# Patient Record
Sex: Male | Born: 1980 | Hispanic: Yes | Marital: Single | Smoking: Never smoker
Health system: Southern US, Community
[De-identification: ages and names within clinical notes are randomized; demographics above are authoritative.]

## PROBLEM LIST (undated history)

## (undated) DIAGNOSIS — N200 Calculus of kidney: Secondary | ICD-10-CM

## (undated) DIAGNOSIS — R319 Hematuria, unspecified: Secondary | ICD-10-CM

## (undated) HISTORY — PX: OTHER SURGICAL HISTORY: SHX169

---

## 2014-12-05 ENCOUNTER — Ambulatory Visit (INDEPENDENT_AMBULATORY_CARE_PROVIDER_SITE_OTHER): Payer: Self-pay | Admitting: Urgent Care

## 2014-12-05 VITALS — BP 122/74 | HR 58 | Temp 98.6°F | Resp 17 | Ht 66.5 in | Wt 177.0 lb

## 2014-12-05 DIAGNOSIS — K219 Gastro-esophageal reflux disease without esophagitis: Secondary | ICD-10-CM | POA: Insufficient documentation

## 2014-12-05 DIAGNOSIS — R1013 Epigastric pain: Secondary | ICD-10-CM

## 2014-12-05 DIAGNOSIS — R1010 Upper abdominal pain, unspecified: Secondary | ICD-10-CM | POA: Insufficient documentation

## 2014-12-05 LAB — POCT CBC
Granulocyte percent: 62.6 %G (ref 37–80)
HEMATOCRIT: 48.6 % (ref 43.5–53.7)
HEMOGLOBIN: 16.7 g/dL (ref 14.1–18.1)
LYMPH, POC: 2.3 (ref 0.6–3.4)
MCH, POC: 28.9 pg (ref 27–31.2)
MCHC: 34.4 g/dL (ref 31.8–35.4)
MCV: 84 fL (ref 80–97)
MID (CBC): 0.8 (ref 0–0.9)
MPV: 6.8 fL (ref 0–99.8)
POC Granulocyte: 5.3 (ref 2–6.9)
POC LYMPH %: 27.9 % (ref 10–50)
POC MID %: 9.5 %M (ref 0–12)
Platelet Count, POC: 270 10*3/uL (ref 142–424)
RBC: 5.78 M/uL (ref 4.69–6.13)
RDW, POC: 12.5 %
WBC: 8.4 10*3/uL (ref 4.6–10.2)

## 2014-12-05 LAB — COMPREHENSIVE METABOLIC PANEL
ALK PHOS: 57 U/L (ref 39–117)
ALT: 27 U/L (ref 0–53)
AST: 18 U/L (ref 0–37)
Albumin: 4.4 g/dL (ref 3.5–5.2)
BUN: 15 mg/dL (ref 6–23)
CHLORIDE: 103 meq/L (ref 96–112)
CO2: 27 meq/L (ref 19–32)
CREATININE: 0.79 mg/dL (ref 0.50–1.35)
Calcium: 9.4 mg/dL (ref 8.4–10.5)
GLUCOSE: 96 mg/dL (ref 70–99)
POTASSIUM: 4.7 meq/L (ref 3.5–5.3)
Sodium: 140 mEq/L (ref 135–145)
TOTAL PROTEIN: 7.5 g/dL (ref 6.0–8.3)
Total Bilirubin: 2.2 mg/dL — ABNORMAL HIGH (ref 0.2–1.2)

## 2014-12-05 MED ORDER — ESOMEPRAZOLE MAGNESIUM 20 MG PO PACK: 20.0000 mg | PACK | Freq: Every day | ORAL | Status: AC

## 2014-12-05 NOTE — Progress Notes (Signed)
    MRN: [id number] DOB: 03/19/1981  Subjective:   James Arias is a 34 y.o. male presenting for chief complaint of Sore Throat; Headache; Back Pain; and Chest Pain  Reports 2 month history of epigastric pain, pain is intermittent, achy in nature and sometimes radiates to his right shoulder. Associated with excess burping and saliva. Problem occurs mostly while at work, works in Systems developerproduction at [employer]. Patient's diet is generally unhealthy, does not eat a lot of fiber, consists mainly of Latino food which is greasy, also eats very spicy foods. Denies fevers, chest pain, heart racing, palpitations, shortness of breath, neck pain, jaw pain, limb pain, nausea, vomiting, abdominal pain. Denies family history of heart disease. Has 1 aunt with diagnosis of diabetes. He denies smoking cigarettes or alcohol use. Denies any other aggravating or relieving factors, no other questions or concerns.  James Arias currently has no medications in their medication list. He has No Known Allergies.  James Arias  has no past medical history on file. Also  has no past surgical history on file.  ROS As in subjective.  Objective:   Vitals: BP 122/74 mmHg  Pulse 58  Temp(Src) 98.6 F (37 C) (Oral)  Resp 17  Ht 5' 6.5" (1.689 m)  Wt 177 lb (80.287 kg)  BMI 28.14 kg/m2  SpO2 98%  Physical Exam  Constitutional: He is oriented to person, place, and time. He appears well-developed and well-nourished.  HENT:  Mouth/Throat: Oropharynx is clear and moist.  Cardiovascular: Normal rate, regular rhythm and intact distal pulses.  Exam reveals no gallop and no friction rub.   No murmur heard. Pulmonary/Chest: No respiratory distress. He has no wheezes. He has no rales.  Abdominal: Soft. Bowel sounds are normal. He exhibits no distension and no mass. There is no tenderness.  Neurological: He is alert and oriented to person, place, and time.  Skin: Skin is warm and dry. No rash noted. No erythema. No pallor.    Results for orders placed or performed in visit on 12/05/14 (from the past 24 hour(s))  POCT CBC     Status: None   Collection Time: 12/05/14  9:46 AM  Result Value Ref Range   WBC 8.4 4.6 - 10.2 K/uL   Lymph, poc 2.3 0.6 - 3.4   POC LYMPH PERCENT 27.9 10 - 50 %L   MID (cbc) 0.8 0 - 0.9   POC MID % 9.5 0 - 12 %M   POC Granulocyte 5.3 2 - 6.9   Granulocyte percent 62.6 37 - 80 %G   RBC 5.78 4.69 - 6.13 M/uL   Hemoglobin 16.7 14.1 - 18.1 g/dL   HCT, POC 40.948.6 81.143.5 - 53.7 %   MCV 84.0 80 - 97 fL   MCH, POC 28.9 27 - 31.2 pg   MCHC 34.4 31.8 - 35.4 g/dL   RDW, POC 91.412.5 %   Platelet Count, POC 270 142 - 424 K/uL   MPV 6.8 0 - 99.8 fL   Assessment and Plan :   1. Abdominal pain, epigastric 2. Gastroesophageal reflux disease, esophagitis presence not specified - Labs pending, pysical exam findings reassuring, recommended dietary modifications, start Nexium daily, followup in 4 weeks.  Wallis BambergMario Neleh Muldoon, PA-C [facility] [phone] 12/05/2014 9:09 AM

## 2014-12-05 NOTE — Patient Instructions (Signed)
Opciones de alimentos para pacientes con reflujo gastroesofgico (Food Choices for Gastroesophageal Reflux Disease) Cuando se tiene reflujo gastroesofgico (ERGE), los alimentos que se ingieren y los hbitos de alimentacin son muy importantes. Elegir los alimentos adecuados puede ayudar a aliviar las molestias ocasionadas por el ERGE. QU PAUTAS GENERALES DEBO SEGUIR?  Elija las frutas, los vegetales, los cereales integrales, los productos lcteos, la carne de vaca, de pescado y de ave con bajo contenido de grasas.  Limite las grasas, como los aceites, los aderezos para ensalada, la manteca, los frutos secos y el aguacate.  Lleve un registro de las comidas para identificar los alimentos que ocasionan sntomas.  Evite los alimentos que le ocasionen reflujo. Pueden ser distintos para cada persona.  Haga comidas pequeas con frecuencia en lugar de tres comidas abundantes todos los das.  Coma lentamente, en un clima distendido.  Limite el consumo de alimentos fritos.  Cocine los alimentos utilizando mtodos que no sean la fritura.  Evite el consumo alcohol.  Evite beber grandes cantidades de lquidos con las comidas.  Evite agacharse o recostarse hasta despus de 2 o 3horas de haber comido. QU ALIMENTOS NO SE RECOMIENDAN? Los siguientes son algunos alimentos y bebidas que pueden empeorar los sntomas: Vegetales Tomates. Jugo de tomate. Salsa de tomate y espagueti. Ajes. Cebolla y ajo. Rbano picante. Frutas Naranjas, pomelos y limn (fruta y jugo). Carnes Carnes de vaca, de pescado y de ave con gran contenido de grasas. Esto incluye los perros calientes, las costillas, el jamn, la salchicha, el salame y el tocino. Lcteos Leche entera y leche chocolatada. Crema cida. Crema. Mantequilla. Helados. Queso crema.  Bebidas Caf y t negro, con o sin cafena Bebidas gaseosas o energizantes. Condimentos Salsa picante. Salsa barbacoa.  Dulces/postres Chocolate y cacao.  Rosquillas. Menta y mentol. Grasas y aceites Alimentos con alto contenido de grasas, incluidas las papas fritas. Otros Vinagre. Especias picantes, como la pimienta negra, la pimienta blanca, la pimienta roja, la pimienta de cayena, el curry en polvo, los clavos de olor, el jengibre y el chile en polvo. Los artculos mencionados arriba pueden no ser una lista completa de las bebidas y los alimentos que se deben evitar. Comunquese con el nutricionista para recibir ms informacin. Document Released: 02/18/2005 Document Revised: 05/16/2013 ExitCare Patient Information 2015 [employer]. This information is not intended to replace advice given to you by your health care provider. Make sure you discuss any questions you have with your health care provider.  

## 2015-07-09 ENCOUNTER — Ambulatory Visit (INDEPENDENT_AMBULATORY_CARE_PROVIDER_SITE_OTHER): Payer: Self-pay | Admitting: Urgent Care

## 2015-07-09 VITALS — BP 126/77 | HR 60 | Temp 97.9°F | Resp 16 | Ht 66.25 in | Wt 178.4 lb

## 2015-07-09 DIAGNOSIS — R109 Unspecified abdominal pain: Secondary | ICD-10-CM

## 2015-07-09 DIAGNOSIS — M545 Low back pain, unspecified: Secondary | ICD-10-CM

## 2015-07-09 DIAGNOSIS — R350 Frequency of micturition: Secondary | ICD-10-CM

## 2015-07-09 DIAGNOSIS — R631 Polydipsia: Secondary | ICD-10-CM

## 2015-07-09 DIAGNOSIS — M62830 Muscle spasm of back: Secondary | ICD-10-CM

## 2015-07-09 LAB — POCT CBC
GRANULOCYTE PERCENT: 62 % (ref 37–80)
HCT, POC: 44.1 % (ref 43.5–53.7)
HEMOGLOBIN: 16 g/dL (ref 14.1–18.1)
Lymph, poc: 2.7 (ref 0.6–3.4)
MCH: 30.3 pg (ref 27–31.2)
MCHC: 36.3 g/dL — AB (ref 31.8–35.4)
MCV: 83.2 fL (ref 80–97)
MID (cbc): 1.1 — AB (ref 0–0.9)
MPV: 6.6 fL (ref 0–99.8)
PLATELET COUNT, POC: 277 10*3/uL (ref 142–424)
POC Granulocyte: 6.3 (ref 2–6.9)
POC LYMPH PERCENT: 26.7 %L (ref 10–50)
POC MID %: 11.3 %M (ref 0–12)
RBC: 5.3 M/uL (ref 4.69–6.13)
RDW, POC: 13 %
WBC: 10.1 10*3/uL (ref 4.6–10.2)

## 2015-07-09 LAB — POC MICROSCOPIC URINALYSIS (UMFC): Mucus: ABSENT

## 2015-07-09 LAB — POCT URINALYSIS DIP (MANUAL ENTRY)
Bilirubin, UA: NEGATIVE
GLUCOSE UA: NEGATIVE
Ketones, POC UA: NEGATIVE
Leukocytes, UA: NEGATIVE
NITRITE UA: NEGATIVE
PH UA: 6
PROTEIN UA: NEGATIVE
RBC UA: NEGATIVE
Spec Grav, UA: 1.02
Urobilinogen, UA: 1

## 2015-07-09 LAB — POCT GLYCOSYLATED HEMOGLOBIN (HGB A1C): Hemoglobin A1C: 5.1

## 2015-07-09 MED ORDER — MELOXICAM 7.5 MG PO TABS: 7.5000 mg | ORAL_TABLET | Freq: Every day | ORAL | Status: AC

## 2015-07-09 MED ORDER — CYCLOBENZAPRINE HCL 5 MG PO TABS
5.0000 mg | ORAL_TABLET | Freq: Three times a day (TID) | ORAL | Status: AC | PRN
Start: 2015-07-09 — End: ?

## 2015-07-09 NOTE — Patient Instructions (Signed)
Dolor de espalda en adultos  (Back Pain, Adult)  El dolor de espalda es muy frecuente en los adultos. La causa del dolor de espalda es rara vez peligrosa y el dolor a menudo mejora con el tiempo. Es posible que se desconozca la causa de esta afección. Algunas causas comunes son las siguientes:  · Distensión de los músculos o ligamentos que sostienen la columna vertebral.  · Desgaste (degeneración) de los discos vertebrales.  · Artritis.  · Lesiones directas en la espalda.  En muchas personas, el dolor de espalda es recurrente. Como rara vez es peligroso, las personas pueden aprender a manejar esta afección por sí mismas.  INSTRUCCIONES PARA EL CUIDADO EN EL HOGAR  Controle su dolor de espalda a fin de detectar algún cambio. Las siguientes indicaciones ayudarán a aliviar cualquier molestia que pueda sentir:  · Permanezca activo. Si permanece sentado o de pie en un mismo lugar durante mucho tiempo, se tensiona la espalda. No se siente, conduzca o permanezca de pie en un mismo lugar durante más de 30 minutos seguidos. Realice caminatas cortas en superficies planas tan pronto como le sea posible. Trate de caminar un poco más de tiempo cada día.  · Haga ejercicio regularmente como se lo haya indicado el médico. El ejercicio ayuda a que su espalda se cure más rápidamente. También ayuda a prevenir futuras lesiones al mantener los músculos fuertes y flexibles.  · No permanezca en la cama. Si hace reposo más de 1 a 2 días, puede demorar su recuperación.  · Preste atención a su cuerpo al inclinarse y levantarse. Las posiciones más cómodas son las que ejercen menos tensión en la espalda en recuperación. Siempre use técnicas apropiadas para levantar objetos, como por ejemplo:    Flexionar las rodillas.    Mantener la carga cerca del cuerpo.    No torcerse.  · Encuentre una posición cómoda para dormir. Use un colchón firme y recuéstese de costado con las rodillas ligeramente flexionadas. Si se recuesta sobre la espalda, coloque  una almohada debajo de las rodillas.  · Evite sentir ansiedad o estrés. El estrés aumenta la tensión muscular y puede empeorar el dolor de espalda. Es importante reconocer si se siente ansioso o estresado y aprender maneras de controlarlo, por ejemplo haciendo ejercicio.  · Tome los medicamentos solamente como se lo haya indicado el médico. Los medicamentos de venta libre para aliviar el dolor y la inflamación a menudo son los más eficaces. El médico puede recetarle relajantes musculares. Estos medicamentos ayudan a calmar el dolor de modo que pueda reanudar más rápidamente sus actividades normales y el ejercicio saludable.  · Aplique hielo sobre la zona lesionada.    Ponga el hielo en una bolsa plástica.    Coloque una toalla entre la piel y la bolsa de hielo.    Deje el hielo durante 20 minutos, 2 a 3 veces por día, durante los primeros 2 o 3 días. Después de eso, puede alternar el hielo y el calor para reducir el dolor y los espasmos.  · Mantenga un peso saludable. El exceso de peso ejerce presión adicional sobre la espalda y hace que resulte difícil mantener una buena postura.  SOLICITE ATENCIÓN MÉDICA SI:  · Siente un dolor que no se alivia con reposo o medicamentos.  · Siente mucho dolor que se extiende a las piernas o los glúteos.  · El dolor no mejora en una semana.  · Siente dolor por la noche.  · Pierde peso.  · Siente escalofríos o fiebre.  SOLICITE ATENCIÓN MÉDICA DE INMEDIATO SI:   ·   Tiene nuevos problemas para controlar la vejiga o los intestinos.  · Siente debilidad o adormecimiento inusuales en los brazos o en las piernas.  · Siente náuseas o vómitos.  · Siente dolor abdominal.  · Siente que va a desmayarse.     Esta información no tiene como fin reemplazar el consejo del médico. Asegúrese de hacerle al médico cualquier pregunta que tenga.     Document Released: 05/11/2005 Document Revised: 06/01/2014  Elsevier Interactive Patient Education ©2016 Elsevier Inc.

## 2015-07-09 NOTE — Progress Notes (Signed)
MRN: [id number] DOB: 08/02/1980  Subjective:   James Arias is a 35 y.o. male presenting for chief complaint of Dysuria and Back Pain  Reports ~2 month history of flank pain worse with bending and stooping. He also reports urinary frequency, polydipsia, does not hydrate well however but does not drink sodas. Reports several uncles with diagnosis of diabetes. Patient admits that he made significant changes in his diet from his last visit due to GERD symptoms. This problem has since resolved. However, patient admits that he eats plenty of carbs. Denies fever, n/v, abdominal pain, dysuria, hematuria, cloudy urine. Denies smoking cigarettes, alcohol use. 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 He does exercise regularly, plays soccer.  [place] has a current medication list which includes the following prescription(s): esomeprazole. Also has No Known Allergies.  James Arias  has no past medical history on file. Also  has no past surgical history on file.  Objective:   Vitals: BP 126/77 mmHg  Pulse 60  Temp(Src) 97.9 F (36.6 C) (Oral)  Resp 16  Ht 5' 6.25" (1.683 m)  Wt 178 lb 6.4 oz (80.922 kg)  BMI 28.57 kg/m2  SpO2 98%  Physical Exam  Constitutional: He is oriented to person, place, and time. He appears well-developed and well-nourished.  Cardiovascular: Normal rate, regular rhythm and intact distal pulses.  Exam reveals no gallop and no friction rub.   No murmur heard. Pulmonary/Chest: No respiratory distress. He has no wheezes. He has no rales.  Abdominal: Soft. Bowel sounds are normal. He exhibits no distension and no mass. There is no tenderness.  Musculoskeletal:       Lumbar back: He exhibits decreased range of motion (flexion), tenderness (over area depicted) and spasm (lumbar region). He exhibits no bony tenderness, no swelling, no edema, no deformity and no laceration.       Back:  Neurological: He is alert and oriented to person,  place, and time.  Skin: Skin is warm and dry.   Results for orders placed or performed in visit on 07/09/15 (from the past 24 hour(s))  POCT urinalysis dipstick     Status: None   Collection Time: 07/09/15 10:30 AM  Result Value Ref Range   Color, UA yellow yellow   Clarity, UA clear clear   Glucose, UA negative negative   Bilirubin, UA negative negative   Ketones, POC UA negative negative   Spec Grav, UA 1.020    Blood, UA negative negative   pH, UA 6.0    Protein Ur, POC negative negative   Urobilinogen, UA 1.0    Nitrite, UA Negative Negative   Leukocytes, UA Negative Negative  POCT Microscopic Urinalysis ([facility])     Status: None   Collection Time: 07/09/15 10:30 AM  Result Value Ref Range   WBC,UR,HPF,POC None None WBC/hpf   RBC,UR,HPF,POC None None RBC/hpf   Bacteria None None, Too numerous to count   Mucus Absent Absent   Epithelial Cells, UR Per Microscopy None None, Too numerous to count cells/hpf  POCT CBC     Status: Abnormal   Collection Time: 07/09/15 11:05 AM  Result Value Ref Range   WBC 10.1 4.6 - 10.2 K/uL   Lymph, poc 2.7 0.6 - 3.4   POC LYMPH PERCENT 26.7 10 - 50 %L   MID (cbc) 1.1 (A) 0 - 0.9   POC MID % 11.3 0 - 12 %M   POC Granulocyte 6.3 2 - 6.9   Granulocyte percent 62.0  37 - 80 %G   RBC 5.30 4.69 - 6.13 M/uL   Hemoglobin 16.0 14.1 - 18.1 g/dL   HCT, POC 16.1 09.6 - 53.7 %   MCV 83.2 80 - 97 fL   MCH, POC 30.3 27 - 31.2 pg   MCHC 36.3 (A) 31.8 - 35.4 g/dL   RDW, POC 04.5 %   Platelet Count, POC 277 142 - 424 K/uL   MPV 6.6 0 - 99.8 fL  POCT glycosylated hemoglobin (Hb A1C)     Status: None   Collection Time: 07/09/15 11:06 AM  Result Value Ref Range   Hemoglobin A1C 5.1    Assessment and Plan :   1. Bilateral low back pain without sciatica 2. Lumbar paraspinal muscle spasm - Likely musculoskeletal in origin, work restrictions provided for 2 weeks. Recheck at that point if no improvement. Consider x-ray, PT, additional blood work.  3.  Urinary frequency 4. Polydipsia - Unclear etiology but patient does admit to drinking Gatorade daily at work. Advised that he stop this to see if it helps with his urinary frequency. Patient agreed.  Wallis Bamberg, PA-C [facility] [phone] 07/09/2015 10:44 AM

## 2018-02-08 ENCOUNTER — Encounter: Payer: Self-pay | Admitting: Emergency Medicine

## 2018-02-08 ENCOUNTER — Ambulatory Visit: Payer: Self-pay | Admitting: Emergency Medicine

## 2018-02-08 VITALS — BP 124/74 | HR 99 | Temp 98.9°F | Resp 16 | Ht 65.25 in | Wt 177.2 lb

## 2018-02-08 DIAGNOSIS — R1010 Upper abdominal pain, unspecified: Secondary | ICD-10-CM

## 2018-02-08 DIAGNOSIS — R12 Heartburn: Secondary | ICD-10-CM | POA: Insufficient documentation

## 2018-02-08 MED ORDER — OMEPRAZOLE 20 MG PO CPDR: 20.0000 mg | DELAYED_RELEASE_CAPSULE | Freq: Every day | ORAL | 3 refills | Status: AC

## 2018-02-08 NOTE — Progress Notes (Signed)
James Arias 37 y.o.   Chief Complaint  Patient presents with  . Abdominal Pain    x 3 days    HISTORY OF PRESENT ILLNESS: This is a 37 y.o. male complaining of upper abdominal pain that started 3 days ago similar to an episode he had 6 to 7 months ago when he was seen at an urgent care clinic and started on Nexium for 1 month.  He did well them.  Still eating and drinking okay.  Denies nausea or vomiting.  Denies fever or chills.  Denies melena or rectal bleeding.  At times he gets pain to the epigastric area with heartburn but other times the pain is localized to the right upper quadrant.  Food makes it worse.  No other significant symptoms.  HPI   Prior to Admission medications   Medication Sig Start Date End Date Taking? Authorizing Provider  cyclobenzaprine (FLEXERIL) 5 MG tablet Take 1-2 tablets (5-10 mg total) by mouth 3 (three) times daily as needed for muscle spasms. Patient not taking: Reported on 02/08/2018 07/09/15   Wallis Bamberg, PA-C  esomeprazole (NEXIUM) 20 MG packet Take 20 mg by mouth daily before breakfast. Patient not taking: Reported on 07/09/2015 12/05/14   Wallis Bamberg, PA-C  meloxicam (MOBIC) 7.5 MG tablet Take 1-2 tablets (7.5-15 mg total) by mouth daily. Patient not taking: Reported on 02/08/2018 07/09/15   Wallis Bamberg, PA-C    No Known Allergies  Patient Active Problem List   Diagnosis Date Noted  . Abdominal pain, epigastric 12/05/2014  . Esophageal reflux 12/05/2014    No past medical history on file.  No past surgical history on file.  Social History   Socioeconomic History  . Marital status: Single    Spouse name: Not on file  . Number of children: Not on file  . Years of education: Not on file  . Highest education level: Not on file  Occupational History  . Not on file  Social Needs  . Financial resource strain: Not on file  . Food insecurity:    Worry: Not on file    Inability: Not on file  . Transportation needs:    Medical:  Not on file    Non-medical: Not on file  Tobacco Use  . Smoking status: Never Smoker  . Smokeless tobacco: Never Used  Substance and Sexual Activity  . Alcohol use: Not on file  . Drug use: Never  . Sexual activity: Not on file  Lifestyle  . Physical activity:    Days per week: Not on file    Minutes per session: Not on file  . Stress: Not on file  Relationships  . Social connections:    Talks on phone: Not on file    Gets together: Not on file    Attends religious service: Not on file    Active member of club or organization: Not on file    Attends meetings of clubs or organizations: Not on file    Relationship status: Not on file  . Intimate partner violence:    Fear of current or ex partner: Not on file    Emotionally abused: Not on file    Physically abused: Not on file    Forced sexual activity: Not on file  Other Topics Concern  . Not on file  Social History Narrative  . Not on file    No family history on file.   Review of Systems  Constitutional: Negative.  Negative for chills and fever.  HENT:  Negative.   Eyes: Negative.   Respiratory: Negative.  Negative for cough and shortness of breath.   Cardiovascular: Negative.  Negative for chest pain and palpitations.  Gastrointestinal: Positive for abdominal pain and heartburn. Negative for blood in stool, melena, nausea and vomiting.  Genitourinary: Negative.  Negative for dysuria.  Musculoskeletal: Negative.   Skin: Negative.  Negative for rash.  Neurological: Negative.   Endo/Heme/Allergies: Negative.   All other systems reviewed and are negative.   Vitals:   02/08/18 1540  BP: 124/74  Pulse: 99  Resp: 16  Temp: 98.9 F (37.2 C)  SpO2: 97%     Physical Exam  Constitutional: He is oriented to person, place, and time. He appears well-developed and well-nourished.  HENT:  Head: Normocephalic and atraumatic.  Nose: Nose normal.  Eyes: Pupils are equal, round, and reactive to light. Conjunctivae and  EOM are normal.  Neck: Normal range of motion. Neck supple.  Cardiovascular: Normal rate, regular rhythm and normal heart sounds.  Pulmonary/Chest: Effort normal and breath sounds normal.  Abdominal: Soft. Bowel sounds are normal. He exhibits no distension and no mass. There is no tenderness. There is no rebound and no guarding.  Musculoskeletal: Normal range of motion.  Neurological: He is alert and oriented to person, place, and time.  Skin: Skin is warm and dry. Capillary refill takes less than 2 seconds. No rash noted.  Psychiatric: He has a normal mood and affect. His behavior is normal.  Vitals reviewed.    ASSESSMENT & PLAN: James Arias was seen today for abdominal pain.  Diagnoses and all orders for this visit:  Pain of upper abdomen -     CBC with Differential/Platelet -     Comprehensive metabolic panel -     Lipase -     omeprazole (PRILOSEC) 20 MG capsule; Take 1 capsule (20 mg total) by mouth daily.  Heartburn    Patient Instructions  Opciones de alimentos para pacientes con reflujo gastroesofgico - Adultos (Food Choices for Gastroesophageal Reflux Disease, Adult) Cuando se tiene reflujo gastroesofgico (ERGE), los alimentos que se ingieren y los hbitos de alimentacin son muy importantes. Elegir los alimentos adecuados puede ayudar a [employer]. QU PAUTAS DEBO SEGUIR?  Elija las frutas, los vegetales, los cereales integrales y los productos lcteos con bajo contenido de [place].  Elija las carnes de [place], de pescado y de ave con bajo contenido de grasas.  Limite las grasas, [address] [place], los aderezos para [place], la [place], los frutos secos y Programme researcher, broadcasting/film/video.  Lleve un registro de alimentos. Esto ayuda a identificar los alimentos que ocasionan sntomas.  Evite los alimentos que le ocasionen sntomas. Pueden ser distintos para cada persona.  Haga comidas pequeas durante Glass blower/designer de 3 comidas abundantes.  Coma lentamente, en un lugar donde  est distendido.  Limite el consumo de alimentos fritos.  Cocine los alimentos utilizando mtodos que no sean la fritura.  Evite el consumo alcohol.  Evite beber grandes cantidades de lquidos con las comidas.  Evite agacharse o recostarse hasta despus de 2 o 3horas de haber comido.  QU ALIMENTOS NO SE RECOMIENDAN? Estos son algunos alimentos y bebidas que pueden empeorar los sntomas: Veterinary surgeon. Jugo de tomate. Salsa de tomate y espagueti. Ajes. Cebolla y [place]. Rbano picante. Frutas Naranjas, pomelos y limn (fruta y [place]). Carnes Carnes de [place], de pescado y de ave con gran contenido de grasas. Esto incluye los perros calientes, las [place], el [place], la [place], el salame y  el tocino. Lcteos Leche entera y [place]. [employer]. Crema. Mantequilla. Helados. Queso crema. Bebidas T o caf. Bebidas gaseosas o bebidas energizantes. Condimentos Salsa picante. Salsa barbacoa. Dulces/postres Chocolate y cacao. Rosquillas. Menta y mentol. Grasas y [employer]. Esto incluye las papas fritas. Otros Vinagre. Especias picantes. Esto incluye la pimienta negra, la pimienta blanca, la pimienta roja, la pimienta de cayena, el curry en polvo, los clavos de [place], el jengibre y el [place] en polvo. Esta no es [employer] de los alimentos y las bebidas que se Theatre stage manager. Comunquese con el nutricionista para recibir ms informacin. Esta informacin no tiene Theme park manager el consejo del mdico. Asegrese de hacerle al mdico cualquier pregunta que tenga. Document Released: 11/10/2011 Document Revised: 06/01/2014 Document Reviewed: 03/15/2013 Elsevier Interactive Patient Education  2017 Elsevier Inc. Merchant navy officer (Heartburn) La acidez estomacal es un tipo de dolor o de molestia que se puede presentar en la garganta o en el pecho. A menudo se la describe como Actuary. Tambin puede producir mal aliento. La sensacin de [employer] al acostarse o inclinarse. Puede deberse al retroceso (reflujo) de los contenidos estomacales hacia el tubo que conecta la boca con el estmago (esfago). CUIDADOS EN EL HOGAR Tome estas medidas para aliviar las molestias y [employer]. Dieta  Siga la dieta como se lo haya indicado el mdico. Tal vez deba evitar los siguientes alimentos y bebidas: ? Caf y t (con o sin cafena). ? Bebidas que contengan alcohol. ? Bebidas energizantes y deportivas. ? Gaseosas o refrescos. ? Chocolate y cacao. ? Menta y esencias de [address] ? Ajo y cebollas. ? Rbano picante. ? Alimentos muy condimentados y cidos, como pimientos, [place] en polvo, curry en polvo, vinagre, salsas picantes y Engineer, water. ? Frutas ctricas y sus jugos, como naranjas, limones y limas. ? Alimentos a base de tomates, como salsa roja, [place], salsa y pizza con salsa roja. ? Alimentos fritos y Lexicographer, como rosquillas, papas fritas y aderezos con alto contenido de Holiday representative. ? Carnes con alto contenido de [place], como hot dogs, filetes de entrecot, salchicha, jamn y tocino. ? Productos lcteos con alto contenido de grasa, como [place], [place] y [place] crema.  Consuma pequeas porciones de comida con ms frecuencia. Evite consumir porciones abundantes.  Evite beber mucho lquido con las comidas.  No coma durante las 2 o 3horas previas a la hora de [place].  No se acueste inmediatamente despus de comer.  No haga actividad fsica enseguida despus de comer. Instrucciones generales  Est atento a cualquier cambio en los sntomas.  Tome los medicamentos de venta libre y los recetados solamente como se lo haya indicado el mdico. No tome aspirina, ibuprofeno ni otros antiinflamatorios no esteroides (AINE), a menos que el mdico lo autorice.  No consuma ningn producto que contenga tabaco, lo que incluye cigarrillos, tabaco de Theatre manager y Administrator, Civil Service. Si necesita ayuda para dejar de fumar,  consulte al mdico.  Use ropa suelta. No use nada ajustado alrededor [employer].  Levante (eleve) unas 6pulgadas (15centmetros) la cabecera de la cama.  Intente bajar el nivel de estrs. Si necesita ayuda para hacerlo, consulte al [employer].  Si tiene sobrepeso, Media planner un peso saludable. Pregntele a su mdico cmo puede perder peso de manera segura.  Concurra a todas las visitas de control como se lo haya indicado el mdico. Esto es importante. SOLICITE AYUDA SI:  Aparecen nuevos sntomas.  [place]aja de [place] y no [place]  por qu.  Tiene dificultad para tragar o siente dolor al Darden Restaurantshacerlo.  Tiene sibilancias o tos que no desaparece.  Los sntomas no mejoran con Scientist, research (medical)el tratamiento.  Tiene acidez frecuentemente durante ms de Marsh & McLennandos semanas. SOLICITE AYUDA DE INMEDIATO SI:  Tiene dolor en los brazos, el cuello, los Lakeviewmaxilares, la dentadura o la espalda.  Berenice Primasranspira, se marea o tiene sensacin de desvanecimiento.  Siente falta de aire o Journalist, newspaperdolor en el pecho.  Vomita y el vmito es parecido a la sangre o a los granos de caf.  Las heces son sanguinolentas o de color negro. Esta informacin no tiene Theme park managercomo fin reemplazar el consejo del mdico. Asegrese de hacerle al mdico cualquier pregunta que tenga. Document Released: 01/21/2011 Document Revised: 01/30/2015 Document Reviewed: 09/05/2014 Elsevier Interactive Patient Education  2018 Elsevier Inc.      Edwina BarthMiguel Kenzee Bassin, MD [facility]

## 2018-02-08 NOTE — Patient Instructions (Signed)
Opciones de alimentos para pacientes con reflujo gastroesofgico - Adultos (Food Choices for Gastroesophageal Reflux Disease, Adult) Cuando se tiene reflujo gastroesofgico (ERGE), los alimentos que se ingieren y los hbitos de alimentacin son muy importantes. Elegir los alimentos adecuados puede ayudar a [employer]. QU PAUTAS DEBO SEGUIR?  Elija las frutas, los vegetales, los cereales integrales y los productos lcteos con bajo contenido de [place].  Elija las carnes de [place], de pescado y de ave con bajo contenido de grasas.  Limite las grasas, [address] [place], los aderezos para [place], la [place], los frutos secos y Programme researcher, broadcasting/film/video.  Lleve un registro de alimentos. Esto ayuda a identificar los alimentos que ocasionan sntomas.  Evite los alimentos que le ocasionen sntomas. Pueden ser distintos para cada persona.  Haga comidas pequeas durante Glass blower/designer de 3 comidas abundantes.  Coma lentamente, en un lugar donde est distendido.  Limite el consumo de alimentos fritos.  Cocine los alimentos utilizando mtodos que no sean la fritura.  Evite el consumo alcohol.  Evite beber grandes cantidades de lquidos con las comidas.  Evite agacharse o recostarse hasta despus de 2 o 3horas de haber comido.  QU ALIMENTOS NO SE RECOMIENDAN? Estos son algunos alimentos y bebidas que pueden empeorar los sntomas: Veterinary surgeon. Jugo de tomate. Salsa de tomate y espagueti. Ajes. Cebolla y [place]. Rbano picante. Frutas Naranjas, pomelos y limn (fruta y [place]). Carnes Carnes de [place], de pescado y de ave con gran contenido de grasas. Esto incluye los perros calientes, las [place], el [place], la salchicha, el salame y el tocino. Lcteos Leche entera y [place]. [employer]. Crema. Mantequilla. Helados. Queso crema. Bebidas T o caf. Bebidas gaseosas o bebidas energizantes. Condimentos Salsa picante. Salsa barbacoa. Dulces/postres Chocolate y cacao. Rosquillas.  Menta y mentol. Grasas y [employer]. Esto incluye las papas fritas. Otros Vinagre. Especias picantes. Esto incluye la pimienta negra, la pimienta blanca, la pimienta roja, la pimienta de cayena, el curry en polvo, los clavos de [place], el jengibre y el [place] en polvo. Esta no es [employer] de los alimentos y las bebidas que se Theatre stage manager. Comunquese con el nutricionista para recibir ms informacin. Esta informacin no tiene Theme park manager el consejo del mdico. Asegrese de hacerle al mdico cualquier pregunta que tenga. Document Released: 11/10/2011 Document Revised: 06/01/2014 Document Reviewed: 03/15/2013 Elsevier Interactive Patient Education  2017 Elsevier Inc. Merchant navy officer (Heartburn) La acidez estomacal es un tipo de dolor o de molestia que se puede presentar en la garganta o en el pecho. A menudo se la describe como Actuary. Tambin puede producir mal aliento. La sensacin de [employer] al acostarse o inclinarse. Puede deberse al retroceso (reflujo) de los contenidos estomacales hacia el tubo que conecta la boca con el estmago (esfago). CUIDADOS EN EL HOGAR Tome estas medidas para aliviar las molestias y [employer]. Dieta  Siga la dieta como se lo haya indicado el mdico. Tal vez deba evitar los siguientes alimentos y bebidas: ? Caf y t (con o sin cafena). ? Bebidas que contengan alcohol. ? Bebidas energizantes y deportivas. ? Gaseosas o refrescos. ? Chocolate y cacao. ? Menta y esencias de [address] ? Ajo y cebollas. ? Rbano picante. ? Alimentos muy condimentados y cidos, como pimientos, [place] en polvo, curry en polvo, vinagre, salsas picantes y Engineer, water. ? Frutas ctricas y sus jugos, como naranjas, limones y limas. ? Alimentos a base de tomates, como salsa [place], [place], salsa y  pizza con salsa roja. ? Alimentos fritos y Lexicographergrasos, como rosquillas, papas fritas y aderezos con alto contenido de Holiday representativegrasa. ? Carnes  con alto contenido de [place]grasa, como hot dogs, filetes de entrecot, salchicha, jamn y tocino. ? Productos lcteos con alto contenido de grasa, como [place] entera, V[place]amanmantequilla y Royal Kuniaqueso crema.  Consuma pequeas porciones de comida con ms frecuencia. Evite consumir porciones abundantes.  Evite beber mucho lquido con las comidas.  No coma durante las 2 o 3horas previas a la hora de Crab Orchardacostarse.  No se acueste inmediatamente despus de comer.  No haga actividad fsica enseguida despus de comer. Instrucciones generales  Est atento a cualquier cambio en los sntomas.  Tome los medicamentos de venta libre y los recetados solamente como se lo haya indicado el mdico. No tome aspirina, ibuprofeno ni otros antiinflamatorios no esteroides (AINE), a menos que el mdico lo autorice.  No consuma ningn producto que contenga tabaco, lo que incluye cigarrillos, tabaco de Theatre managermascar y Administrator, Civil Servicecigarrillos electrnicos. Si necesita ayuda para dejar de fumar, consulte al mdico.  Use ropa suelta. No use nada ajustado alrededor Reynolds Americande la cintura.  Levante (eleve) unas 6pulgadas (15centmetros) la cabecera de la cama.  Intente bajar el nivel de estrs. Si necesita ayuda para hacerlo, consulte al American Expressmdico.  Si tiene sobrepeso, Media planneradelgace hasta alcanzar un peso saludable. Pregntele a su mdico cmo puede perder peso de manera segura.  Concurra a todas las visitas de control como se lo haya indicado el mdico. Esto es importante. SOLICITE AYUDA SI:  Aparecen nuevos sntomas.  [place] de [place]o y no sabe por qu.  Tiene dificultad para tragar o siente dolor al Darden Restaurantshacerlo.  Tiene sibilancias o tos que no desaparece.  Los sntomas no mejoran con Scientist, research (medical)el tratamiento.  Tiene acidez frecuentemente durante ms de Marsh & McLennandos semanas. SOLICITE AYUDA DE INMEDIATO SI:  Tiene dolor en los brazos, el cuello, los Nenahnezadmaxilares, la dentadura o la espalda.  Berenice Primasranspira, se marea o tiene sensacin de desvanecimiento.  Siente falta de aire o Engineer, manufacturingdolor en el  pecho.  Vomita y el vmito es parecido a la sangre o a los granos de caf.  Las heces son sanguinolentas o de color negro. Esta informacin no tiene Theme park managercomo fin reemplazar el consejo del mdico. Asegrese de hacerle al mdico cualquier pregunta que tenga. Document Released: 01/21/2011 Document Revised: 01/30/2015 Document Reviewed: 09/05/2014 Elsevier Interactive Patient Education  [employer]2018 Elsevier Inc.

## 2018-02-09 LAB — CBC WITH DIFFERENTIAL/PLATELET
Basophils Absolute: 0 10*3/uL (ref 0.0–0.2)
Basos: 0 %
EOS (ABSOLUTE): 0.9 10*3/uL — ABNORMAL HIGH (ref 0.0–0.4)
EOS: 9 %
HEMATOCRIT: 43.9 % (ref 37.5–51.0)
HEMOGLOBIN: 15.1 g/dL (ref 13.0–17.7)
Immature Grans (Abs): 0 10*3/uL (ref 0.0–0.1)
Immature Granulocytes: 0 %
LYMPHS ABS: 2.8 10*3/uL (ref 0.7–3.1)
Lymphs: 30 %
MCH: 29.6 pg (ref 26.6–33.0)
MCHC: 34.4 g/dL (ref 31.5–35.7)
MCV: 86 fL (ref 79–97)
MONOCYTES: 8 %
Monocytes Absolute: 0.7 10*3/uL (ref 0.1–0.9)
NEUTROS ABS: 5.1 10*3/uL (ref 1.4–7.0)
Neutrophils: 53 %
Platelets: 259 10*3/uL (ref 150–450)
RBC: 5.1 x10E6/uL (ref 4.14–5.80)
RDW: 12.9 % (ref 12.3–15.4)
WBC: 9.6 10*3/uL (ref 3.4–10.8)

## 2018-02-09 LAB — COMPREHENSIVE METABOLIC PANEL
A/G RATIO: 1.8 (ref 1.2–2.2)
ALK PHOS: 59 IU/L (ref 39–117)
ALT: 27 IU/L (ref 0–44)
AST: 19 IU/L (ref 0–40)
Albumin: 4.6 g/dL (ref 3.5–5.5)
BILIRUBIN TOTAL: 1.9 mg/dL — AB (ref 0.0–1.2)
BUN/Creatinine Ratio: 20 (ref 9–20)
BUN: 17 mg/dL (ref 6–20)
CALCIUM: 9.1 mg/dL (ref 8.7–10.2)
CHLORIDE: 101 mmol/L (ref 96–106)
CO2: 24 mmol/L (ref 20–29)
Creatinine, Ser: 0.83 mg/dL (ref 0.76–1.27)
GFR calc Af Amer: 129 mL/min/{1.73_m2} (ref >59)
GFR calc non Af Amer: 112 mL/min/{1.73_m2} (ref >59)
GLOBULIN, TOTAL: 2.6 g/dL (ref 1.5–4.5)
Glucose: 91 mg/dL (ref 65–99)
POTASSIUM: 4 mmol/L (ref 3.5–5.2)
SODIUM: 140 mmol/L (ref 134–144)
Total Protein: 7.2 g/dL (ref 6.0–8.5)

## 2018-02-09 LAB — LIPASE: LIPASE: 43 U/L (ref 13–78)

## 2018-02-10 ENCOUNTER — Encounter: Payer: Self-pay | Admitting: *Deleted

## 2018-02-14 ENCOUNTER — Telehealth: Payer: Self-pay

## 2018-02-14 NOTE — Telephone Encounter (Signed)
Pt arrived to office without appt.  Would like to know about lab results, does have letter.  Reviewed labs.  Advised that, per provider, labs are unremarkable.  Provider did not feel that anything needed to be addressed with further treatment at this time. Pt had translator present with him. Expresses understanding.

## 2018-02-16 ENCOUNTER — Emergency Department (HOSPITAL_COMMUNITY)
Admission: EM | Admit: 2018-02-16 | Discharge: 2018-02-16 | Disposition: A | Discharge status: Home / Self Care | Payer: Self-pay | Attending: Emergency Medicine | Admitting: Emergency Medicine

## 2018-02-16 ENCOUNTER — Emergency Department (HOSPITAL_COMMUNITY): Payer: Self-pay

## 2018-02-16 ENCOUNTER — Encounter (HOSPITAL_COMMUNITY): Payer: Self-pay | Admitting: Emergency Medicine

## 2018-02-16 DIAGNOSIS — K2900 Acute gastritis without bleeding: Secondary | ICD-10-CM | POA: Insufficient documentation

## 2018-02-16 DIAGNOSIS — R1011 Right upper quadrant pain: Secondary | ICD-10-CM | POA: Insufficient documentation

## 2018-02-16 LAB — COMPREHENSIVE METABOLIC PANEL
ALBUMIN: 4 g/dL (ref 3.5–5.0)
ALT: 33 U/L (ref 0–44)
ANION GAP: 8 (ref 5–15)
AST: 21 U/L (ref 15–41)
Alkaline Phosphatase: 55 U/L (ref 38–126)
BUN: 14 mg/dL (ref 6–20)
CO2: 26 mmol/L (ref 22–32)
Calcium: 8.9 mg/dL (ref 8.9–10.3)
Chloride: 104 mmol/L (ref 98–111)
Creatinine, Ser: 0.81 mg/dL (ref 0.61–1.24)
GFR calc Af Amer: >60 mL/min (ref >60)
GFR calc non Af Amer: >60 mL/min (ref >60)
GLUCOSE: 103 mg/dL — AB (ref 70–99)
POTASSIUM: 3.9 mmol/L (ref 3.5–5.1)
Sodium: 138 mmol/L (ref 135–145)
Total Bilirubin: 1.8 mg/dL — ABNORMAL HIGH (ref 0.3–1.2)
Total Protein: 7 g/dL (ref 6.5–8.1)

## 2018-02-16 LAB — CBC
HEMATOCRIT: 43.9 % (ref 39.0–52.0)
HEMOGLOBIN: 15 g/dL (ref 13.0–17.0)
MCH: 29.5 pg (ref 26.0–34.0)
MCHC: 34.2 g/dL (ref 30.0–36.0)
MCV: 86.2 fL (ref 78.0–100.0)
Platelets: 227 10*3/uL (ref 150–400)
RBC: 5.09 MIL/uL (ref 4.22–5.81)
RDW: 12.5 % (ref 11.5–15.5)
WBC: 8.5 10*3/uL (ref 4.0–10.5)

## 2018-02-16 LAB — LIPASE, BLOOD: Lipase: 27 U/L (ref 11–51)

## 2018-02-16 MED ORDER — RANITIDINE HCL 150 MG PO TABS: 150.0000 mg | ORAL_TABLET | Freq: Two times a day (BID) | ORAL | 0 refills | Status: AC

## 2018-02-16 MED ORDER — GI COCKTAIL ~~LOC~~
30.0000 mL | Freq: Once | ORAL | Status: AC
  Administered 2018-02-16: 30 mL via ORAL
  Filled 2018-02-16: qty 30

## 2018-02-16 NOTE — ED Provider Notes (Signed)
Patient placed in Quick Look pathway, seen and evaluated   Chief Complaint: RUQ abdominal pain  HPI:   James Arias is a 37 y.o. male  Who presents to the emergency department for evaluation of 1 week of right upper quadrant abdominal pain.  He denies associated nausea vomiting or diarrhea but reports some decreased appetite.  Pain has been acutely worsening in the past 24 hours.  ROS: + Abdominal pain, decreased appetite. -Fevers, nausea, vomiting, diarrhea, urinary symptoms  Physical Exam:   Gen: No distress  Neuro: Awake and Alert  Skin: Warm    Focused Exam: Focal tenderness in the right upper quadrant with guarding. Heart RRR, lungs, CTA bilat  Initiation of care has begun. The patient has been counseled on the process, plan, and necessity for staying for the completion/evaluation, and the remainder of the medical screening examination  Legrand Rams 02/16/18 1655  Pricilla Loveless, MD 02/16/18 [phone]

## 2018-02-16 NOTE — ED Notes (Signed)
Pt stable, ambulatory, states understanding of discharge instructions 

## 2018-02-16 NOTE — ED Provider Notes (Signed)
[facility] EMERGENCY DEPARTMENT Provider Note   CSN: [id number] Arrival date & time: 02/16/18  1531   History   Chief Complaint Chief Complaint  Patient presents with  . Abdominal Pain    HPI James Arias is a 37 y.o. male.  HPI   37 year old male presents today with complaints of abdominal pain.  Patient notes approximately 1 week history of epigastric and right upper quadrant abdominal pain.  He describes this as a pulsing sensation, worse after eating.  He denies any associated nausea vomiting fever, lower abdominal pain, dysuria, diarrhea or constipation.  Patient was seen in outpatient clinic diagnosed with "acid" and started on omeprazole.  He notes he has been taking the medication over the last several days without significant improvement in symptoms.  Patient reports he does not drink alcohol or use ibuprofen.     History reviewed. No pertinent past medical history.  Patient Active Problem List   Diagnosis Date Noted  . Heartburn 02/08/2018  . Pain of upper abdomen 12/05/2014  . Esophageal reflux 12/05/2014    History reviewed. No pertinent surgical history.      Home Medications    Prior to Admission medications   Medication Sig Start Date End Date Taking? Authorizing Provider  cyclobenzaprine (FLEXERIL) 5 MG tablet Take 1-2 tablets (5-10 mg total) by mouth 3 (three) times daily as needed for muscle spasms. Patient not taking: Reported on 02/08/2018 07/09/15   Wallis Bamberg, PA-C  esomeprazole (NEXIUM) 20 MG packet Take 20 mg by mouth daily before breakfast. Patient not taking: Reported on 07/09/2015 12/05/14   Wallis Bamberg, PA-C  meloxicam (MOBIC) 7.5 MG tablet Take 1-2 tablets (7.5-15 mg total) by mouth daily. Patient not taking: Reported on 02/08/2018 07/09/15   Wallis Bamberg, PA-C  omeprazole (PRILOSEC) 20 MG capsule Take 1 capsule (20 mg total) by mouth daily. 02/08/18   Georgina Quint, MD  ranitidine (ZANTAC) 150 MG tablet Take  1 tablet (150 mg total) by mouth 2 (two) times daily. 02/16/18   Eyvonne Mechanic, PA-C    Family History History reviewed. No pertinent family history.  Social History Social History   Tobacco Use  . Smoking status: Never Smoker  . Smokeless tobacco: Never Used  Substance Use Topics  . Alcohol use: Not on file  . Drug use: Never     Allergies   Patient has no known allergies.   Review of Systems Review of Systems  All other systems reviewed and are negative.    Physical Exam Updated Vital Signs BP 123/81   Pulse (!) 57   Temp 98.7 F (37.1 C) (Oral)   Resp 18   SpO2 100%   Physical Exam  Constitutional: He is oriented to person, place, and time. He appears well-developed and well-nourished.  HENT:  Head: Normocephalic and atraumatic.  Eyes: Pupils are equal, round, and reactive to light. Conjunctivae are normal. Right eye exhibits no discharge. Left eye exhibits no discharge. No scleral icterus.  Neck: Normal range of motion. No JVD present. No tracheal deviation present.  Pulmonary/Chest: Effort normal. No stridor.  Abdominal:  Minimal tenderness palpation of epigastric and right upper quadrant, remainder abdomen soft nontender, no masses  Neurological: He is alert and oriented to person, place, and time. Coordination normal.  Psychiatric: He has a normal mood and affect. His behavior is normal. Judgment and thought content normal.  Nursing note and vitals reviewed.    ED Treatments / Results  Labs (all labs ordered are listed, but  only abnormal results are displayed) Labs Reviewed  COMPREHENSIVE METABOLIC PANEL - Abnormal; Notable for the following components:      Result Value   Glucose, Bld 103 (*)    Total Bilirubin 1.8 (*)    All other components within normal limits  LIPASE, BLOOD  CBC    EKG None  Radiology US Abdomen Limited Ruq  Result Date: 02/16/2018 CLINICAL DATA:  Right upper quadrant pain for week EXAM: ULTRASOUND ABDOMEN LIMITED  RIGHT UPPER QUADRANT COMPARISON:  None. FINDINGS: Gallbladder: No gallstones or wall thickening visualized. No sonographic Murphy sign noted by sonographer. Common bile duct: Diameter: 2.7 mm Liver: No focal lesion identified. Within normal limits in parenchymal echogenicity. Portal vein is patent on color Doppler imaging with normal direction of blood flow towards the liver. IMPRESSION: Normal right upper quadrant ultrasound Electronically Signed   By: Judie Petit.  Shick M.D.   On: 02/16/2018 17:54    Procedures Procedures (including critical care time)  Medications Ordered in ED Medications  gi cocktail (Maalox,Lidocaine,Donnatal) (30 mLs Oral Given 02/16/18 1937)     Initial Impression / Assessment and Plan / ED Course  I have reviewed the triage vital signs and the nursing notes.  Pertinent labs & imaging results that were available during my care of the patient were reviewed by me and considered in my medical decision making (see chart for details).     Labs: Lipase, CMP, CBC  Imaging: Ultrasound abdomen limited right upper quadrant  Consults:  Therapeutics:  Discharge Meds:   Assessment/Plan: Patient presentation most consistent with gastritis.  Ultrasound reassuring, labs reassuring no signs of infection or obstructive pathology.  Discharged with symptomatic care, strict return precautions and follow-up information.  Patient verbalized understanding and agreement to today's plan had no further questions or concerns.     Final Clinical Impressions(s) / ED Diagnoses   Final diagnoses:  RUQ pain  Acute gastritis without hemorrhage, unspecified gastritis type    ED Discharge Orders         Ordered    ranitidine (ZANTAC) 150 MG tablet  2 times daily     02/16/18 1948           HedgesTinnie Gens, PA-C 02/17/18 2209    Mancel Bale, MD 02/18/18 [phone]

## 2018-02-16 NOTE — ED Triage Notes (Signed)
Pt presents to ED for assessment of RUQ pain, denies n/v/d, c/o back pain with it.  States 1 week of pain, worsening in the last 24 hours.

## 2018-02-16 NOTE — Discharge Instructions (Addendum)
Please read attached information. If you experience any new or worsening signs or symptoms please return to the emergency room for evaluation. Please follow-up with your primary care provider or specialist as discussed. Please use medication prescribed only as directed and discontinue taking if you have any concerning signs or symptoms.   °

## 2019-07-25 IMAGING — US US ABDOMEN LIMITED
1 series · 14 of 25 positions shown · non-contrast
Comparison: None.

CLINICAL DATA: Right upper quadrant pain for week

EXAM:
ULTRASOUND ABDOMEN LIMITED RIGHT UPPER QUADRANT

[Series 1: us abdomen limited · 0.22mm/px · 14 of 48 slices shown]
[im 1/48]
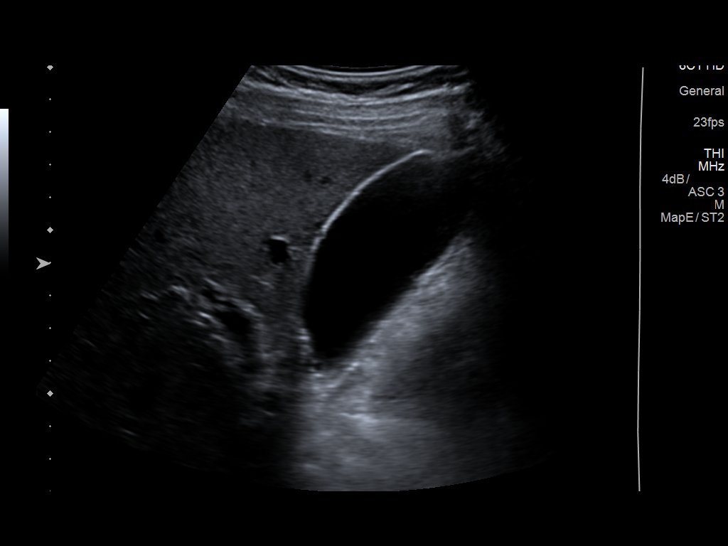
[im 4/48]
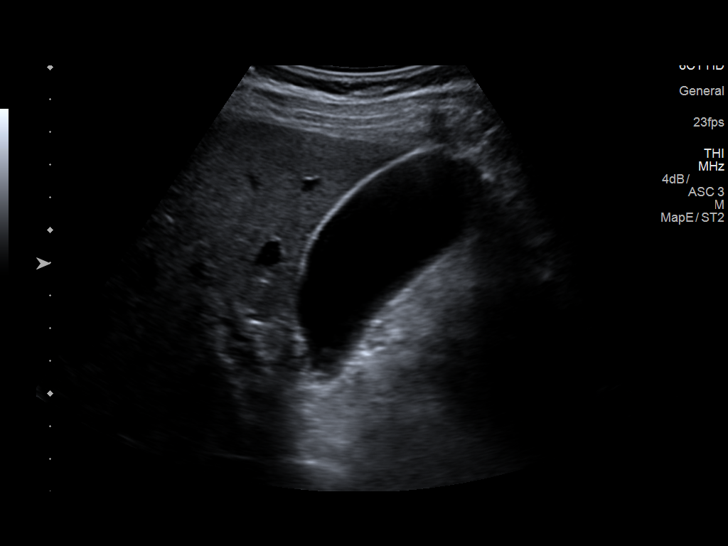
[im 8/48]
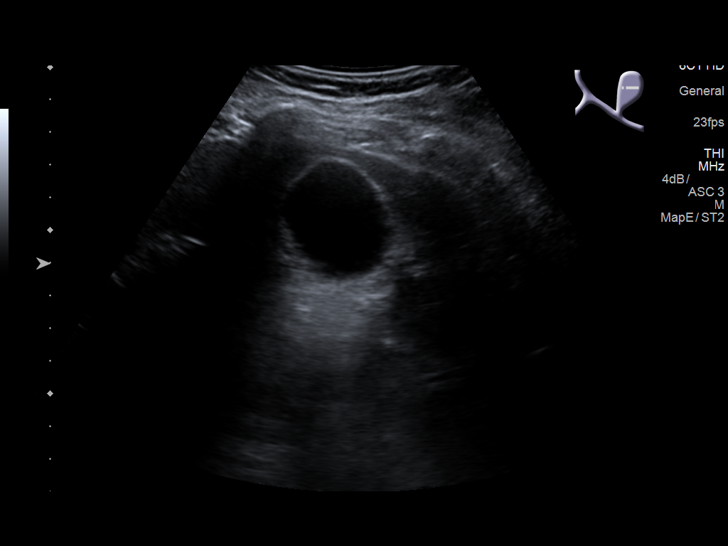
[im 12/48]
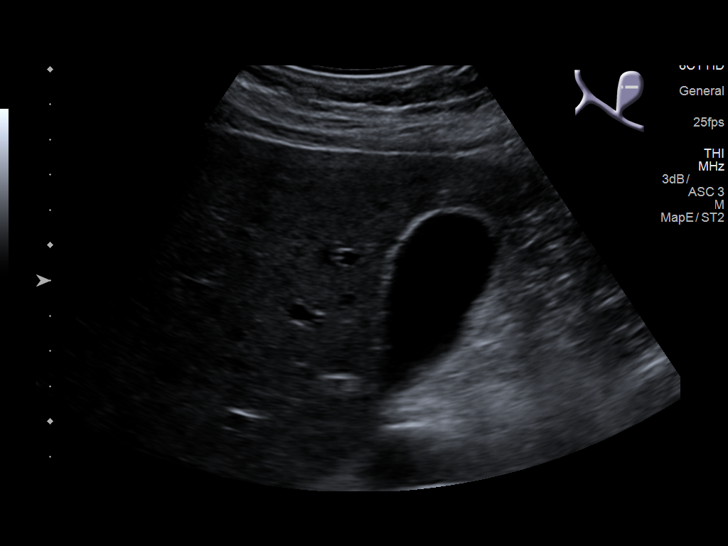
[im 16/48]
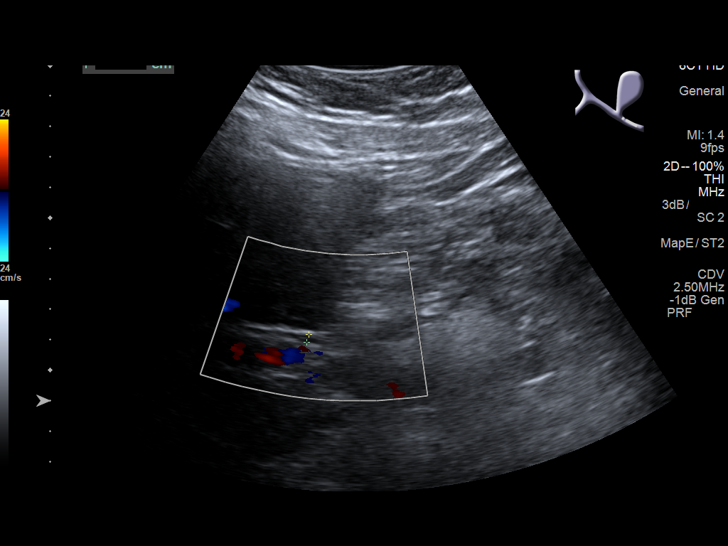
[im 18/48]
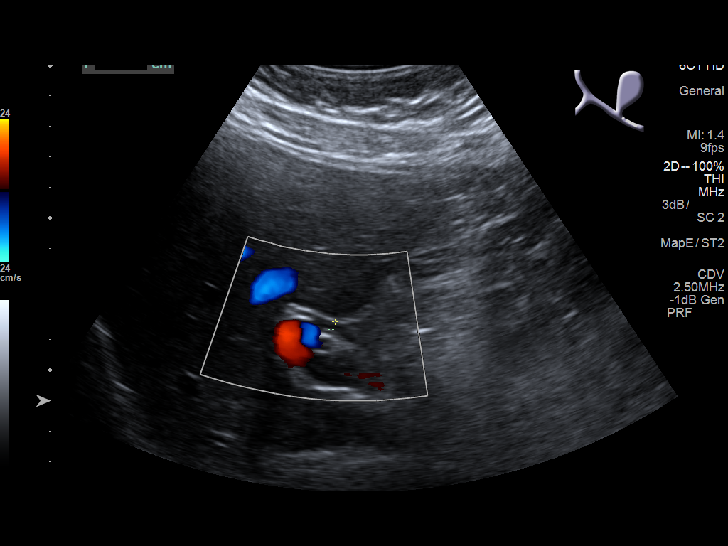
[im 22/48]
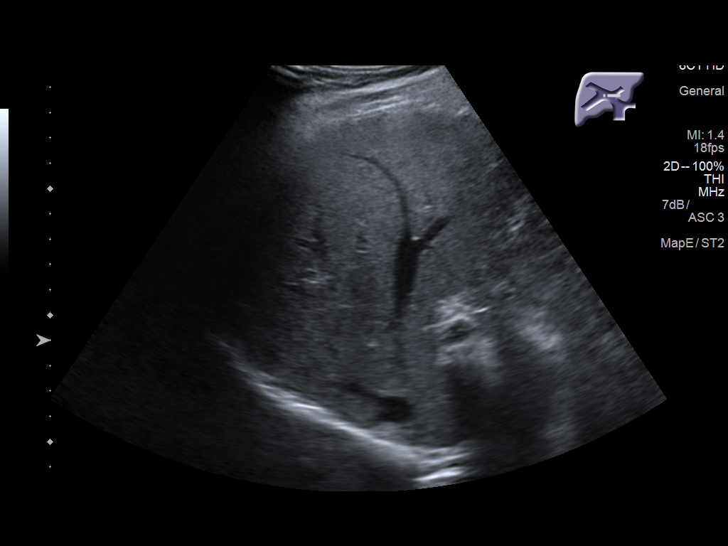
[im 26/48]
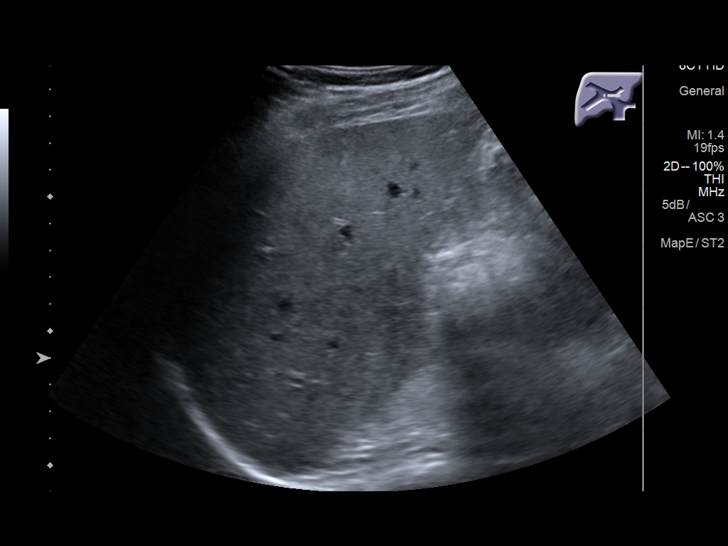
[im 30/48]
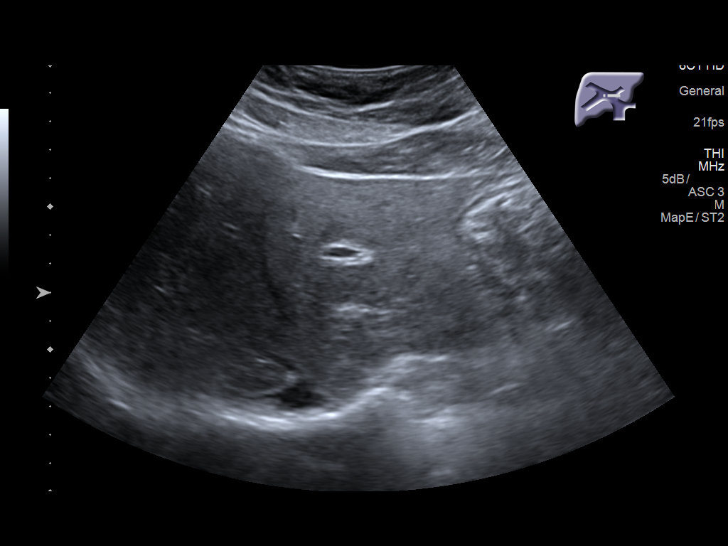
[im 32/48]
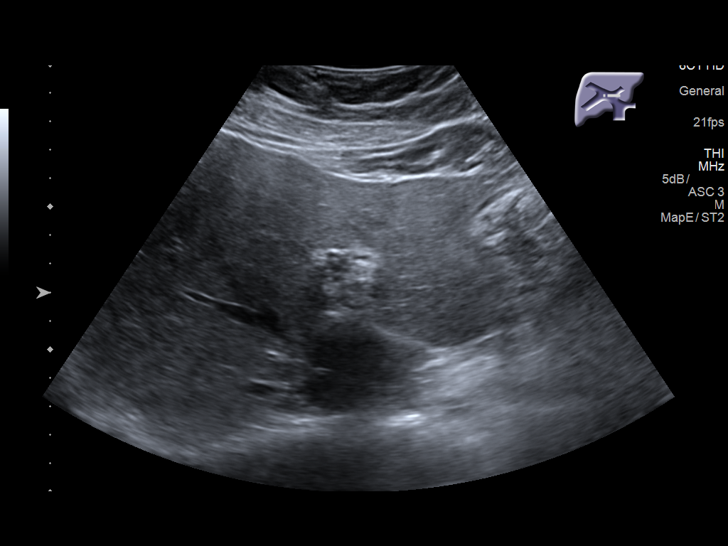
[im 36/48]
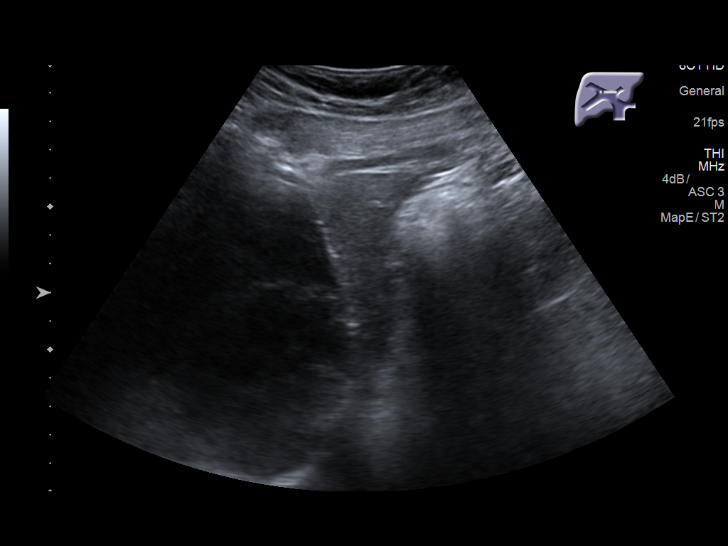
[im 40/48]
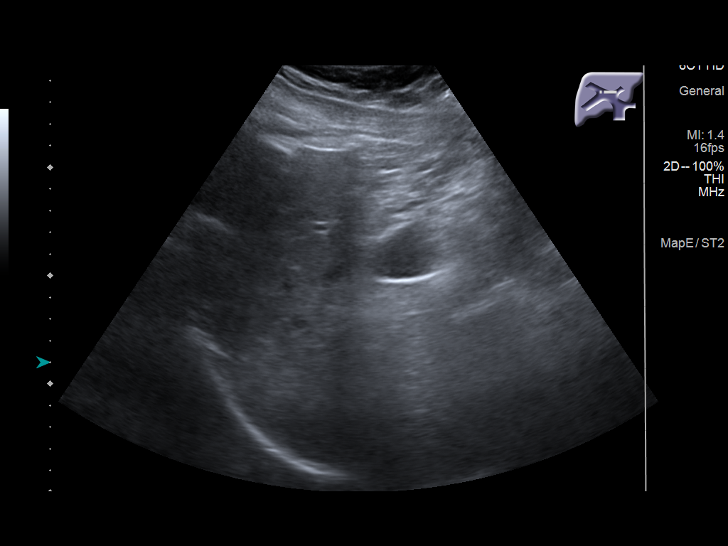
[im 44/48]
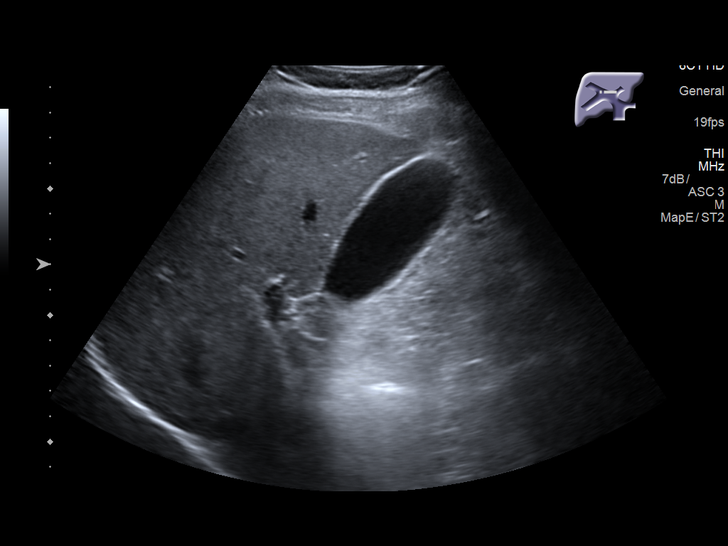
[im 48/48]
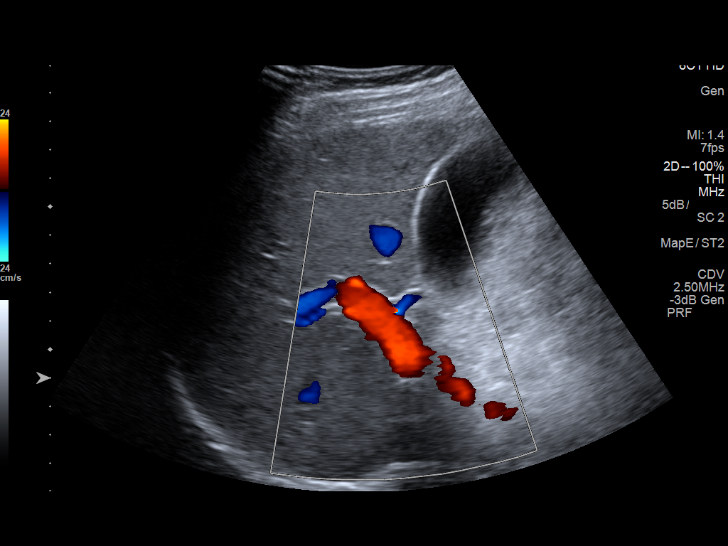

[14 of 25 positions shown; findings below may reference images not displayed]

FINDINGS: Gallbladder:

No gallstones or wall thickening visualized. No sonographic Murphy
sign noted by sonographer.

Common bile duct:

Diameter: 2.7 mm

Liver:

No focal lesion identified. Within normal limits in parenchymal
echogenicity. Portal vein is patent on color Doppler imaging with
normal direction of blood flow towards the liver.
IMPRESSION: Normal right upper quadrant ultrasound

## 2019-08-11 ENCOUNTER — Ambulatory Visit: Payer: Self-pay | Attending: Internal Medicine

## 2019-08-11 DIAGNOSIS — Z23 Encounter for immunization: Secondary | ICD-10-CM

## 2019-08-11 NOTE — Progress Notes (Signed)
   Covid-19 Vaccination Clinic  Name:  Glenard Keesling    MRN: [id number] DOB: 01-Mar-1981  08/11/2019  Mr. Prieto-Alvarado was observed post Covid-19 immunization for 15 minutes without incident. He was provided with Vaccine Information Sheet and instruction to access the V-Safe system.   Mr. Baldwin was instructed to call 911 with any severe reactions post vaccine: Marland Kitchen Difficulty breathing  . Swelling of face and throat  . A fast heartbeat  . A bad rash all over body  . Dizziness and weakness   Immunizations Administered    Name Date Dose VIS Date Route   Pfizer COVID-19 Vaccine 08/11/2019  3:49 PM 0.3 mL 05/05/2019 Intramuscular   Manufacturer: [employer], [employer]   Lot: [id number]   NDC: [address]-0

## 2019-09-06 ENCOUNTER — Ambulatory Visit: Payer: Self-pay | Attending: Internal Medicine

## 2019-09-06 DIAGNOSIS — Z23 Encounter for immunization: Secondary | ICD-10-CM

## 2019-09-06 NOTE — Progress Notes (Signed)
   Covid-19 Vaccination Clinic  Name:  Aldin Drees    MRN: [id number] DOB: [birth year]  09/06/2019  Mr. Prieto-Alvarado was observed post Covid-19 immunization for 15 minutes without incident. He was provided with Vaccine Information Sheet and instruction to access the V-Safe system.   Mr. Foti was instructed to call 911 with any severe reactions post vaccine: Marland Kitchen Difficulty breathing  . Swelling of face and throat  . A fast heartbeat  . A bad rash all over body  . Dizziness and weakness   Immunizations Administered    Name Date Dose VIS Date Route   Pfizer COVID-19 Vaccine 09/06/2019  3:28 PM 0.3 mL 05/05/2019 Intramuscular   Manufacturer: [employer], [employer]   Lot: [id number]   NDC: [address]-0

## 2020-05-30 ENCOUNTER — Other Ambulatory Visit: Payer: Self-pay

## 2020-05-30 ENCOUNTER — Emergency Department (HOSPITAL_COMMUNITY): Payer: Self-pay

## 2020-05-30 ENCOUNTER — Encounter (HOSPITAL_COMMUNITY): Payer: Self-pay | Admitting: Emergency Medicine

## 2020-05-30 ENCOUNTER — Emergency Department (HOSPITAL_COMMUNITY)
Admission: EM | Admit: 2020-05-30 | Discharge: 2020-05-30 | Disposition: A | Discharge status: Home / Self Care | Payer: Self-pay | Attending: Emergency Medicine | Admitting: Emergency Medicine

## 2020-05-30 DIAGNOSIS — N201 Calculus of ureter: Secondary | ICD-10-CM | POA: Insufficient documentation

## 2020-05-30 DIAGNOSIS — K219 Gastro-esophageal reflux disease without esophagitis: Secondary | ICD-10-CM | POA: Insufficient documentation

## 2020-05-30 LAB — COMPREHENSIVE METABOLIC PANEL
ALT: 34 U/L (ref 0–44)
AST: 24 U/L (ref 15–41)
Albumin: 4 g/dL (ref 3.5–5.0)
Alkaline Phosphatase: 47 U/L (ref 38–126)
Anion gap: 10 (ref 5–15)
BUN: 22 mg/dL — ABNORMAL HIGH (ref 6–20)
CO2: 24 mmol/L (ref 22–32)
Calcium: 9.2 mg/dL (ref 8.9–10.3)
Chloride: 106 mmol/L (ref 98–111)
Creatinine, Ser: 1.09 mg/dL (ref 0.61–1.24)
GFR, Estimated: >60 mL/min (ref >60)
Glucose, Bld: 137 mg/dL — ABNORMAL HIGH (ref 70–99)
Potassium: 3.4 mmol/L — ABNORMAL LOW (ref 3.5–5.1)
Sodium: 140 mmol/L (ref 135–145)
Total Bilirubin: 1.9 mg/dL — ABNORMAL HIGH (ref 0.3–1.2)
Total Protein: 7.3 g/dL (ref 6.5–8.1)

## 2020-05-30 LAB — URINALYSIS, ROUTINE W REFLEX MICROSCOPIC
Bilirubin Urine: NEGATIVE
Glucose, UA: NEGATIVE mg/dL
Ketones, ur: NEGATIVE mg/dL
Leukocytes,Ua: NEGATIVE
Nitrite: NEGATIVE
Protein, ur: NEGATIVE mg/dL
Specific Gravity, Urine: 1.017 (ref 1.005–1.030)
pH: 7 (ref 5.0–8.0)

## 2020-05-30 LAB — CBC
HCT: 42.8 % (ref 39.0–52.0)
Hemoglobin: 15.4 g/dL (ref 13.0–17.0)
MCH: 30.2 pg (ref 26.0–34.0)
MCHC: 36 g/dL (ref 30.0–36.0)
MCV: 83.9 fL (ref 80.0–100.0)
Platelets: 275 10*3/uL (ref 150–400)
RBC: 5.1 MIL/uL (ref 4.22–5.81)
RDW: 12.6 % (ref 11.5–15.5)
WBC: 13.6 10*3/uL — ABNORMAL HIGH (ref 4.0–10.5)
nRBC: 0 % (ref 0.0–0.2)

## 2020-05-30 LAB — LIPASE, BLOOD: Lipase: 24 U/L (ref 11–51)

## 2020-05-30 MED ORDER — OXYCODONE-ACETAMINOPHEN 5-325 MG PO TABS
1.0000 | ORAL_TABLET | Freq: Once | ORAL | Status: AC
  Administered 2020-05-30: 1 via ORAL
  Filled 2020-05-30: qty 1

## 2020-05-30 MED ORDER — OXYCODONE-ACETAMINOPHEN 5-325 MG PO TABS: 1.0000 | ORAL_TABLET | Freq: Three times a day (TID) | ORAL | 0 refills | Status: AC | PRN

## 2020-05-30 MED ORDER — ONDANSETRON 4 MG PO TBDP: 4.0000 mg | ORAL_TABLET | Freq: Three times a day (TID) | ORAL | 0 refills | Status: AC | PRN

## 2020-05-30 NOTE — ED Notes (Signed)
Pt d/c by MD and is provided w/ d/c instructions and follow up care, Pt out of the ed ambulatory by self

## 2020-05-30 NOTE — ED Triage Notes (Signed)
Pt arrives to ED with c/o RLQ abdominal pain that started x1 hour ago. Pain is constant and sharp. Radiation to LLQ. X1 episodes of emesis. Has chills and nausea.

## 2020-05-30 NOTE — ED Provider Notes (Signed)
[facility] EMERGENCY DEPARTMENT Provider Note   CSN: [id number] Arrival date & time: 05/30/20  1118     History Chief Complaint  Patient presents with  . Abdominal Pain    Tyland Klemens is a 40 y.o. male with a past medical history of GERD presenting to the ED with a chief complaint of abdominal pain.  Around 10 AM started having right lower quadrant abdominal pain radiating to his flank and back.  He does have some urinary frequency and feeling like he is not emptying his bladder completely.  Reports 1 episode of nonbloody emesis.  Has not tried medications to help with pain.  Reports somewhat similar pain in the past but was told "there is nothing going on."  He denies any prior abdominal surgeries but appears that he did have a possible inguinal hernia repair done.  He denies any chest pain, testicular pain or swelling, changes to bowel movements, fever or history of kidney stones.  History is provided by [place] medical interpreter.  HPI     History reviewed. No pertinent past medical history.  Patient Active Problem List   Diagnosis Date Noted  . Heartburn 02/08/2018  . Pain of upper abdomen 12/05/2014  . Esophageal reflux 12/05/2014    History reviewed. No pertinent surgical history.     History reviewed. No pertinent family history.  Social History   Tobacco Use  . Smoking status: Never Smoker  . Smokeless tobacco: Never Used  Substance Use Topics  . Drug use: Never    Home Medications Prior to Admission medications   Medication Sig Start Date End Date Taking? Authorizing Provider  ondansetron (ZOFRAN ODT) 4 MG disintegrating tablet Take 1 tablet (4 mg total) by mouth every 8 (eight) hours as needed for nausea or vomiting. 05/30/20  Yes Kiaraliz Rafuse, PA-C  oxyCODONE-acetaminophen (PERCOCET/ROXICET) 5-325 MG tablet Take 1 tablet by mouth every 8 (eight) hours as needed for severe pain. 05/30/20  Yes Angela Vazguez, PA-C  cyclobenzaprine  (FLEXERIL) 5 MG tablet Take 1-2 tablets (5-10 mg total) by mouth 3 (three) times daily as needed for muscle spasms. Patient not taking: No sig reported 07/09/15   Wallis Bamberg, PA-C  esomeprazole (NEXIUM) 20 MG packet Take 20 mg by mouth daily before breakfast. Patient not taking: No sig reported 12/05/14   Wallis Bamberg, PA-C  meloxicam (MOBIC) 7.5 MG tablet Take 1-2 tablets (7.5-15 mg total) by mouth daily. Patient not taking: No sig reported 07/09/15   Wallis Bamberg, PA-C  omeprazole (PRILOSEC) 20 MG capsule Take 1 capsule (20 mg total) by mouth daily. Patient not taking: No sig reported 02/08/18   Georgina Quint, MD  ranitidine (ZANTAC) 150 MG tablet Take 1 tablet (150 mg total) by mouth 2 (two) times daily. Patient not taking: No sig reported 02/16/18   Eyvonne Mechanic, PA-C    Allergies    Patient has no known allergies.  Review of Systems   Review of Systems  Constitutional: Negative for appetite change, chills and fever.  HENT: Negative for ear pain, rhinorrhea, sneezing and sore throat.   Eyes: Negative for photophobia and visual disturbance.  Respiratory: Negative for cough, chest tightness, shortness of breath and wheezing.   Cardiovascular: Negative for chest pain and palpitations.  Gastrointestinal: Positive for abdominal pain and vomiting. Negative for blood in stool, constipation, diarrhea and nausea.  Genitourinary: Positive for difficulty urinating, flank pain and frequency. Negative for dysuria, hematuria and urgency.  Musculoskeletal: Negative for myalgias.  Skin: Negative for rash.  Neurological: Negative for dizziness, weakness and light-headedness.    Physical Exam Updated Vital Signs BP 114/69   Pulse (!) 58   Temp 98.6 F (37 C) (Oral)   Resp 18   SpO2 97%   Physical Exam Vitals and nursing note reviewed.  Constitutional:      General: He is not in acute distress.    Appearance: He is well-developed and well-nourished.  HENT:     Head: Normocephalic  and atraumatic.     Nose: Nose normal.  Eyes:     General: No scleral icterus.       Right eye: No discharge.        Left eye: No discharge.     Extraocular Movements: EOM normal.     Conjunctiva/sclera: Conjunctivae normal.  Cardiovascular:     Rate and Rhythm: Normal rate and regular rhythm.     Pulses: Intact distal pulses.     Heart sounds: Normal heart sounds. No murmur heard. No friction rub. No gallop.   Pulmonary:     Effort: Pulmonary effort is normal. No respiratory distress.     Breath sounds: Normal breath sounds.  Abdominal:     General: Bowel sounds are normal. There is no distension.     Palpations: Abdomen is soft.     Tenderness: There is abdominal tenderness (Right lower quadrant, right flank). There is no guarding.  Musculoskeletal:        General: No edema. Normal range of motion.     Cervical back: Normal range of motion and neck supple.  Skin:    General: Skin is warm and dry.     Findings: No rash.  Neurological:     Mental Status: He is alert.     Motor: No abnormal muscle tone.     Coordination: Coordination normal.  Psychiatric:        Mood and Affect: Mood and affect normal.     ED Results / Procedures / Treatments   Labs (all labs ordered are listed, but only abnormal results are displayed) Labs Reviewed  COMPREHENSIVE METABOLIC PANEL - Abnormal; Notable for the following components:      Result Value   Potassium 3.4 (*)    Glucose, Bld 137 (*)    BUN 22 (*)    Total Bilirubin 1.9 (*)    All other components within normal limits  CBC - Abnormal; Notable for the following components:   WBC 13.6 (*)    All other components within normal limits  URINALYSIS, ROUTINE W REFLEX MICROSCOPIC - Abnormal; Notable for the following components:   APPearance CLOUDY (*)    Hgb urine dipstick MODERATE (*)    Bacteria, UA RARE (*)    All other components within normal limits  URINE CULTURE  LIPASE, BLOOD    EKG None  Radiology CT Renal Stone  Study  Result Date: 05/30/2020 CLINICAL DATA:  RIGHT flank pain, suspected kidney stone EXAM: CT ABDOMEN AND PELVIS WITHOUT CONTRAST TECHNIQUE: Multidetector CT imaging of the abdomen and pelvis was performed following the standard protocol without IV contrast. COMPARISON:  Ultrasound of the abdomen from February 16, 2018 FINDINGS: Lower chest: Lung bases are clear. No sign of consolidation or evidence of pleural effusion. Mild mosaic ground-glass attenuation at the lung bases bilaterally could reflect atelectatic changes Hepatobiliary: Smooth hepatic contours. No pericholecystic stranding. Pancreas: Normal contour of the pancreas without ductal dilation grossly or sign of inflammation. Spleen: Normal splenic contour and size. Adrenals/Urinary Tract: Adrenal glands are normal. Marked  perinephric stranding on the RIGHT with mild RIGHT-sided hydroureteronephrosis. Tiny calcific density in the area of the RIGHT UVJ measuring approximately 2 mm. Urinary bladder with smooth contours. No additional signs of renal or ureteral calculi. Stomach/Bowel: Stomach under distended. Mild distension of small bowel loops in the RIGHT lower quadrant with some fecalized material in the lumen. No overt sign of obstruction. Appendix top normal size with dense material in the lumen and no sign of Peri pannus seal stranding stool throughout much of the colon no pericolonic stranding. Vascular/Lymphatic: Normal caliber abdominal aorta. There is no gastrohepatic or hepatoduodenal ligament lymphadenopathy. No retroperitoneal or mesenteric lymphadenopathy. No pelvic sidewall lymphadenopathy. Reproductive: Prostate unremarkable by CT. Other: Signs of prior RIGHT inguinal herniorrhaphy. No ascites. Small amount of fluid along the course of the RIGHT ureter anterior to the psoas likely related to inflammatory stranding. Musculoskeletal: No acute musculoskeletal process. No destructive bone finding. IMPRESSION: 1. 2 mm calculus in the area of  the RIGHT UVJ with mild RIGHT-sided hydroureteronephrosis and marked perinephric stranding. 2. Mild distension of small bowel loops in the RIGHT lower quadrant with some fecalized material in the lumen. No overt sign of obstruction. Findings could be related to mild ileus. 3. Signs of prior RIGHT inguinal herniorrhaphy. 4. Mild mosaic ground-glass attenuation at the lung bases bilaterally could reflect atelectatic changes. Correlate with any respiratory symptoms that would suggest viral illness. 5. Top-normal appendix with either appendicoliths in the lumen or retained contrast from previous barium evaluation. Electronically Signed   By: Zetta Bills M.D.   On: 05/30/2020 17:25    Procedures Procedures (including critical care time)  Medications Ordered in ED Medications  oxyCODONE-acetaminophen (PERCOCET/ROXICET) 5-325 MG per tablet 1 tablet (1 tablet Oral Given 05/30/20 1627)    ED Course  I have reviewed the triage vital signs and the nursing notes.  Pertinent labs & imaging results that were available during my care of the patient were reviewed by me and considered in my medical decision making (see chart for details).  Clinical Course as of 05/30/20 1827  Thu May 30, 2020  1633 Hgb urine dipstickMarland Kitchen): MODERATE [HK]  1633 RBC / HPF: 11-20 Will check for kidney stone. [HK]    Clinical Course User Index [HK] Delia Heady, PA-C   MDM Rules/Calculators/A&P                          40 year old male with past medical history of GERD presenting to the ED with a chief complaint of abdominal pain.  Around 10 AM started having right lower quadrant abdominal pain radiating to the flank and back.  Reports feelings of incomplete voiding.  One episode of nonbloody emesis associated with the pain.  On exam he has some tenderness of the right lower quadrant and right flank.  He is in no acute distress.  Denies any testicular pain or swelling.  Lab work significant for leukocytosis of 13.6.  Urinalysis  with hematuria.  Other lab work is unremarkable.  Will obtain CT renal stone study to rule out kidney stone based on his hematuria.  6:27 PM CT renal stone study shows 2 mm right sided ureteral lithiasis near the UVJ.  There is stranding which I feel is secondary to the stone.  His urine does have rare bacteria but I will send this for a culture.  He is afebrile without any recent use of antipyretics.  I feel that his leukocytosis is more reactive from his pain.  Patient  symptoms controlled here and here is in no acute distress.  Question viral respiratory illness on his CT but patient asymptomatic and is declining Covid testing.  He is not hypoxic.  Will treat with pain medication, antiemetic and have him follow-up with urology.  We will have him follow-up with results of urine culture if it is available.  Return precautions given.  Patient is hemodynamically stable, in NAD, and able to ambulate in the ED. Evaluation does not show pathology that would require ongoing emergent intervention or inpatient treatment. I explained the diagnosis to the patient. Pain has been managed and has no complaints prior to discharge. Patient is comfortable with above plan and is stable for discharge at this time. All questions were answered prior to disposition. Strict return precautions for returning to the ED were discussed. Encouraged follow up with PCP.   Prior to providing a prescription for a controlled substance, I independently reviewed the patient's recent prescription history on the [place] Controlled Substance Reporting System. The patient had no recent or regular prescriptions and was deemed appropriate for a brief, less than 3 day prescription of narcotic for acute analgesia.  An After Visit Summary was printed and given to the patient.   Portions of this note were generated with Scientist, clinical (histocompatibility and immunogenetics). Dictation errors may occur despite best attempts at proofreading.  Final Clinical  Impression(s) / ED Diagnoses Final diagnoses:  Ureterolithiasis    Rx / DC Orders ED Discharge Orders         Ordered    oxyCODONE-acetaminophen (PERCOCET/ROXICET) 5-325 MG tablet  Every 8 hours PRN        05/30/20 1746    ondansetron (ZOFRAN ODT) 4 MG disintegrating tablet  Every 8 hours PRN        05/30/20 1746           Dietrich Pates, PA-C 05/30/20 1830    Long, Arlyss Repress, MD 05/31/20 [phone]

## 2020-05-30 NOTE — Discharge Instructions (Addendum)
Your CT scan today showed a 2 mm kidney stone on the right side. This is what is causing your pain. I have provided you with medications to help with your pain, nausea.  Make sure you are drinking plenty of fluids. Your urine was sent for culture so if you need to be on an antibiotic based on the bacteria grown we will call you and let you know If your symptoms do not improve in 4 to 5 days you will need to follow-up with the urologist listed below. Return to the ER if you start to experience fevers, worsening pain, uncontrollable vomiting, chest pain or shortness of breath.   Su tomografa computarizada de hoy mostr un clculo renal de 2 mm en el lado derecho. Esto es lo que le est causando dolor. Le he proporcionado medicamentos para Engineer, materials y las nuseas. Asegrese de beber muchos lquidos. Su orina fue enviada para cultivo, por lo que si necesita tomar un antibitico en funcin de las bacterias que crecen, lo llamaremos y se lo Engineer, mining. Si sus sntomas no mejoran en 4 a 5 das, deber Education officer, environmental un seguimiento con el urlogo que se indica a continuacin. Regrese a la sala de emergencias si comienza a experimentar fiebre, empeoramiento del dolor, vmitos incontrolables, dolor en el pecho o dificultad para respirar.

## 2020-06-01 LAB — URINE CULTURE: Culture: <10000 — AB

## 2021-06-23 IMAGING — CT CT RENAL STONE PROTOCOL
2 of 4 series · 15 of 46 positions shown, 17 images · non-contrast
Comparison: Ultrasound of the abdomen from February 16, 2018

CLINICAL DATA: RIGHT flank pain, suspected kidney stone

EXAM:
CT ABDOMEN AND PELVIS WITHOUT CONTRAST
TECHNIQUE: Multidetector CT imaging of the abdomen and pelvis was performed
following the standard protocol without IV contrast.

[Series 3: ap without · axial · non-contrast · 0.69mm/px · z∈[-609,-164]mm · 12 of 101 slices shown, 14 images]
[im 6/101  soft-tissue]
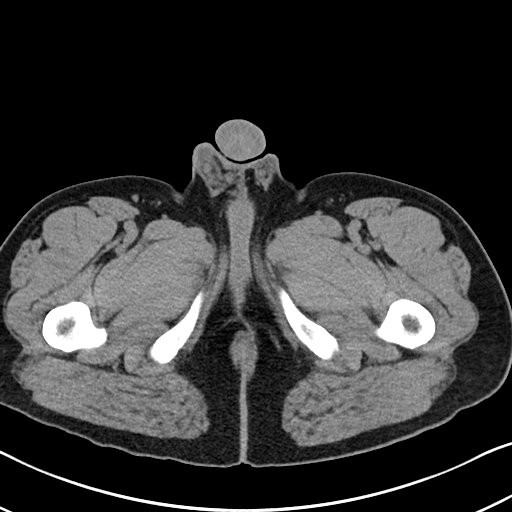
[im 6/101  bone]
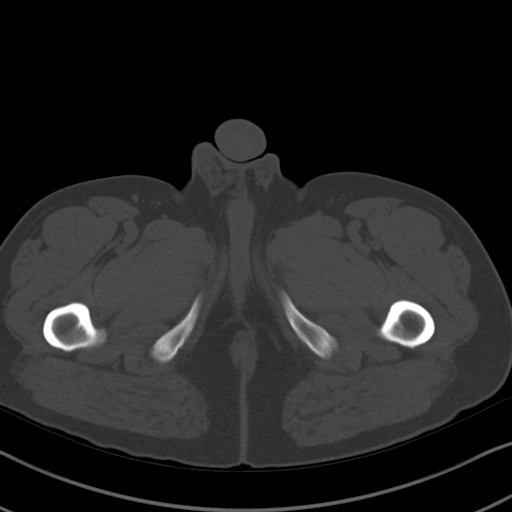
[im 18/101  soft-tissue]
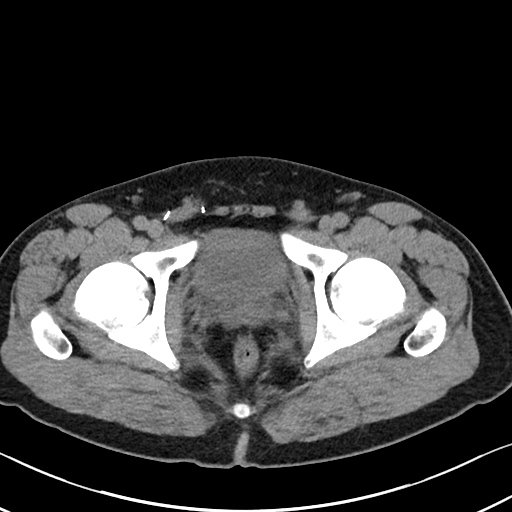
[im 24/101  soft-tissue]
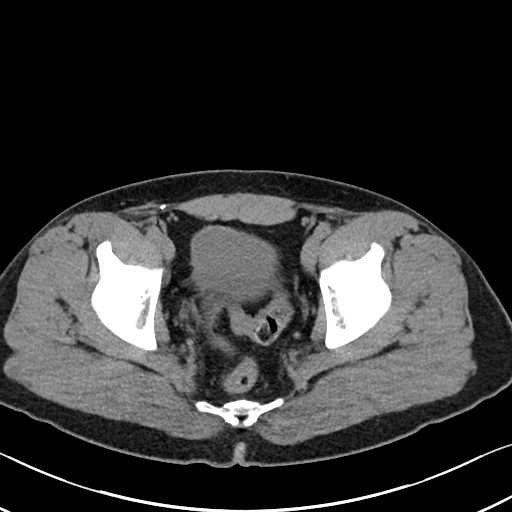
[im 30/101  soft-tissue]
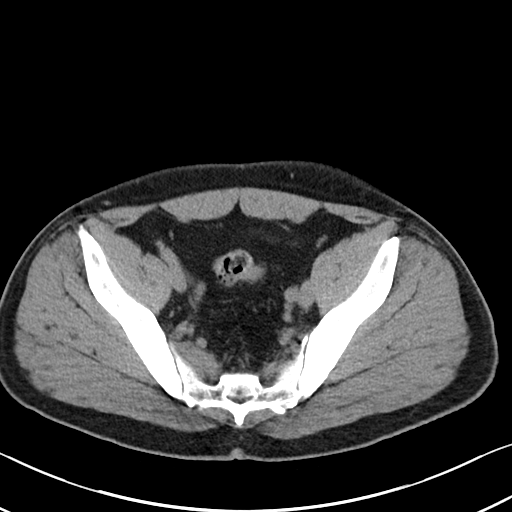
[im 42/101  soft-tissue]
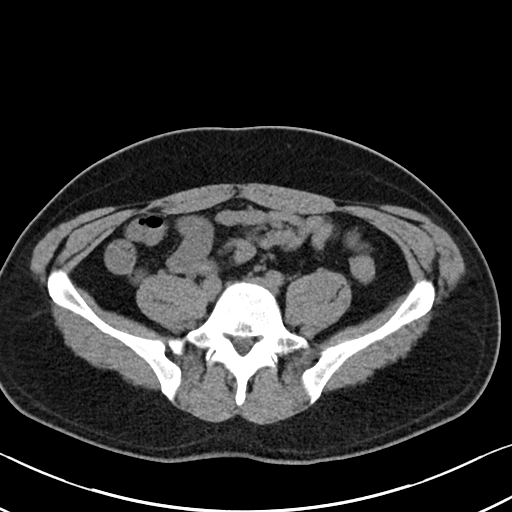
[im 48/101  soft-tissue]
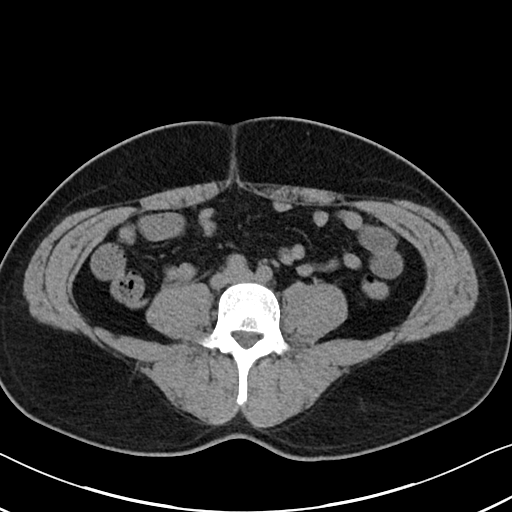
[im 53/101  soft-tissue]
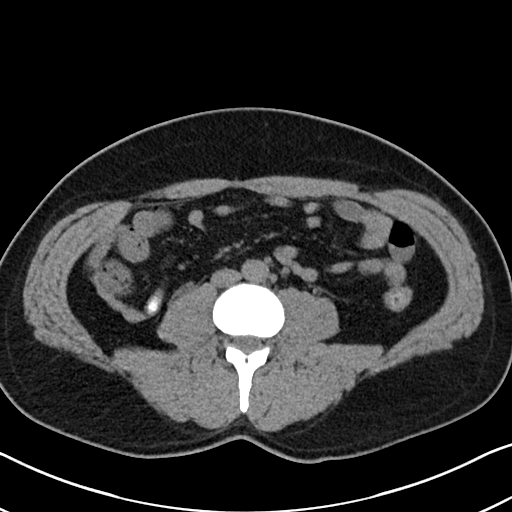
[im 65/101  soft-tissue]
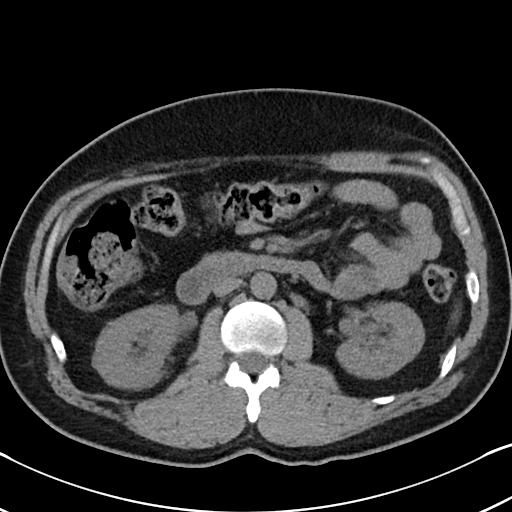
[im 71/101  soft-tissue]
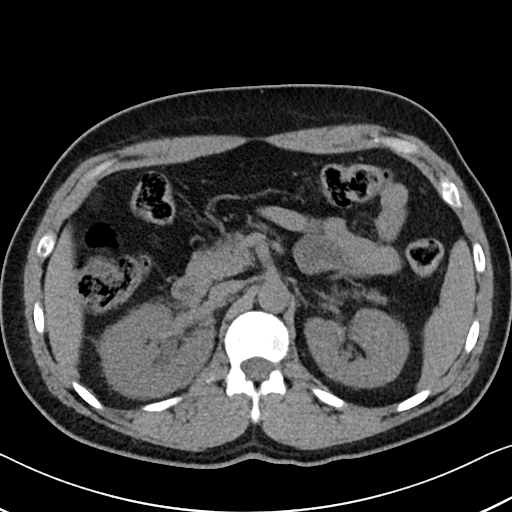
[im 71/101  bone]
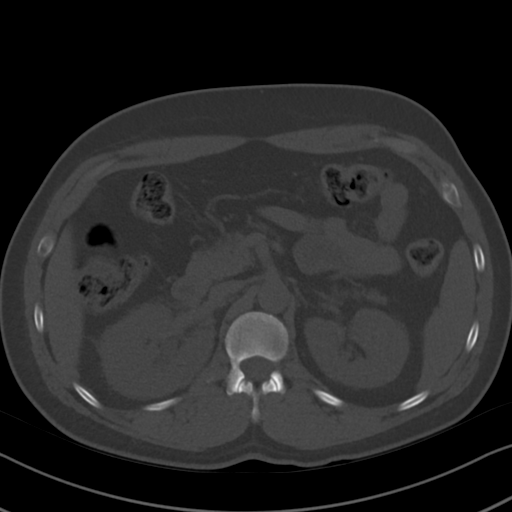
[im 77/101  soft-tissue]
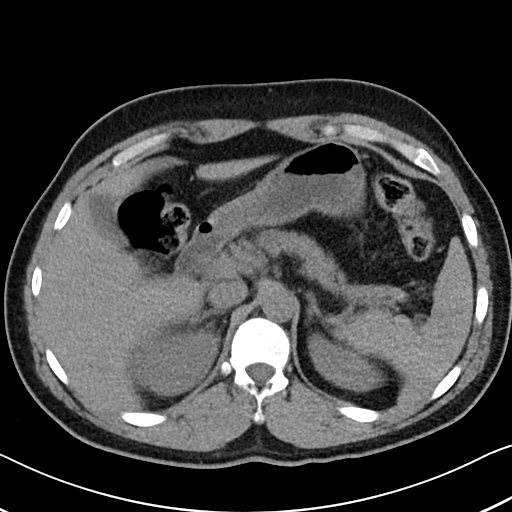
[im 89/101  soft-tissue]
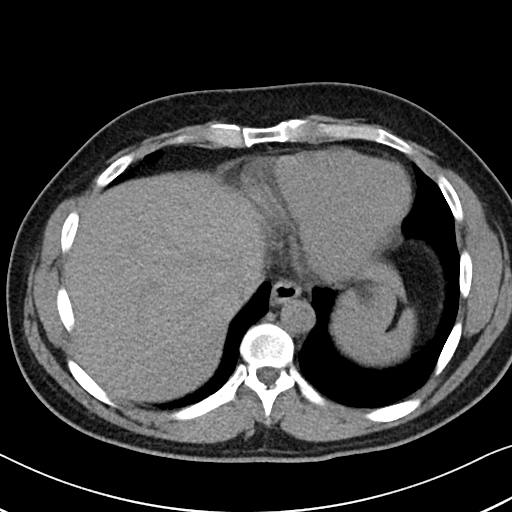
[im 95/101  soft-tissue]
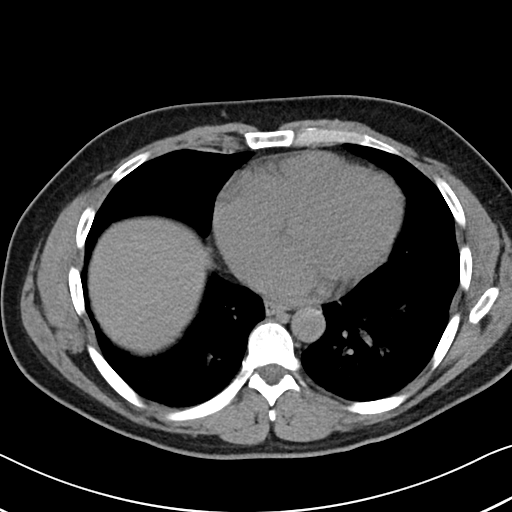

[Series 6: cor · coronal · 0.78mm/px · 3 of 91 slices shown]
[im 31/91  soft-tissue]
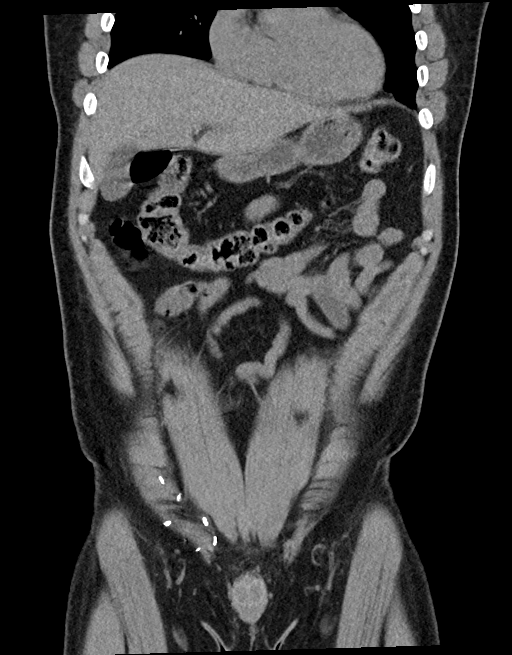
[im 41/91  soft-tissue]
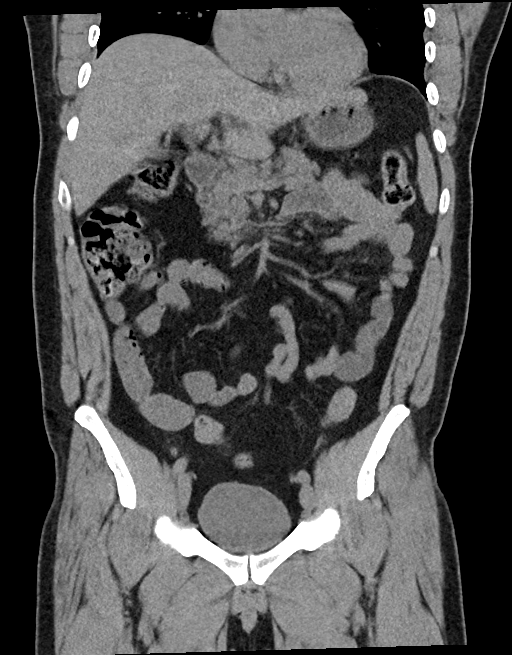
[im 51/91  soft-tissue]
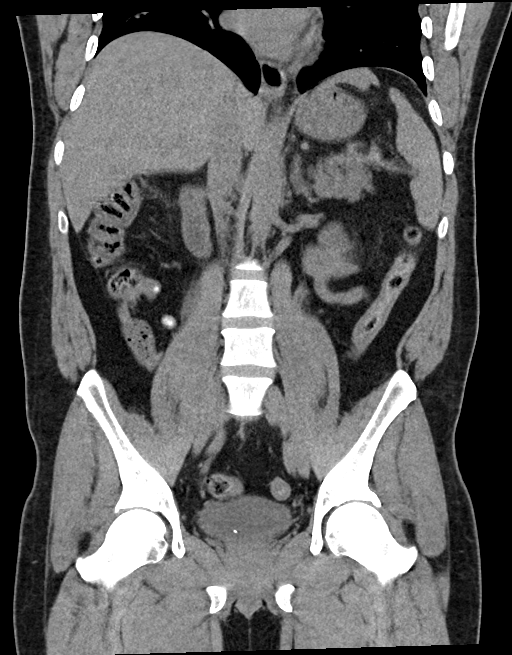

[15 of 46 positions shown; findings below may reference images not displayed]

FINDINGS: Lower chest: Lung bases are clear. No sign of consolidation or
evidence of pleural effusion. Mild mosaic ground-glass attenuation
at the lung bases bilaterally could reflect atelectatic changes

Hepatobiliary: Smooth hepatic contours. No pericholecystic
stranding.

Pancreas: Normal contour of the pancreas without ductal dilation
grossly or sign of inflammation.

Spleen: Normal splenic contour and size.

Adrenals/Urinary Tract: Adrenal glands are normal.

Marked perinephric stranding on the RIGHT with mild RIGHT-sided
hydroureteronephrosis. Tiny calcific density in the area of the
RIGHT UVJ measuring approximately 2 mm. Urinary bladder with smooth
contours. No additional signs of renal or ureteral calculi.

Stomach/Bowel: Stomach under distended. Mild distension of small
bowel loops in the RIGHT lower quadrant with some fecalized material
in the lumen. No overt sign of obstruction.

Appendix top normal size with dense material in the lumen and no
sign of Peri pannus seal stranding stool throughout much of the
colon no pericolonic stranding.

Vascular/Lymphatic: Normal caliber abdominal aorta. There is no
gastrohepatic or hepatoduodenal ligament lymphadenopathy. No
retroperitoneal or mesenteric lymphadenopathy.

No pelvic sidewall lymphadenopathy.

Reproductive: Prostate unremarkable by CT.

Other: Signs of prior RIGHT inguinal herniorrhaphy. No ascites.
Small amount of fluid along the course of the RIGHT ureter anterior
to the psoas likely related to inflammatory stranding.

Musculoskeletal: No acute musculoskeletal process. No destructive
bone finding.
IMPRESSION: 1. 2 mm calculus in the area of the RIGHT UVJ with mild RIGHT-sided
hydroureteronephrosis and marked perinephric stranding.
2. Mild distension of small bowel loops in the RIGHT lower quadrant
with some fecalized material in the lumen. No overt sign of
obstruction. Findings could be related to mild ileus.
3. Signs of prior RIGHT inguinal herniorrhaphy.
4. Mild mosaic ground-glass attenuation at the lung bases
bilaterally could reflect atelectatic changes. Correlate with any
respiratory symptoms that would suggest viral illness.
5. Top-normal appendix with either appendicoliths in the lumen or
retained contrast from previous barium evaluation.

## 2022-10-08 ENCOUNTER — Other Ambulatory Visit (HOSPITAL_COMMUNITY): Payer: Self-pay | Admitting: Family Medicine

## 2022-10-08 DIAGNOSIS — R109 Unspecified abdominal pain: Secondary | ICD-10-CM

## 2022-10-08 DIAGNOSIS — R1031 Right lower quadrant pain: Secondary | ICD-10-CM

## 2022-10-08 DIAGNOSIS — R319 Hematuria, unspecified: Secondary | ICD-10-CM

## 2022-10-08 DIAGNOSIS — Z87442 Personal history of urinary calculi: Secondary | ICD-10-CM

## 2022-10-08 DIAGNOSIS — R1013 Epigastric pain: Secondary | ICD-10-CM

## 2022-10-09 ENCOUNTER — Other Ambulatory Visit (HOSPITAL_COMMUNITY): Payer: Self-pay | Admitting: Family Medicine

## 2022-10-09 DIAGNOSIS — Z87442 Personal history of urinary calculi: Secondary | ICD-10-CM

## 2022-10-09 DIAGNOSIS — R109 Unspecified abdominal pain: Secondary | ICD-10-CM

## 2022-10-09 DIAGNOSIS — R1013 Epigastric pain: Secondary | ICD-10-CM

## 2022-10-09 DIAGNOSIS — R319 Hematuria, unspecified: Secondary | ICD-10-CM

## 2022-10-09 DIAGNOSIS — R1031 Right lower quadrant pain: Secondary | ICD-10-CM

## 2022-10-26 ENCOUNTER — Encounter (HOSPITAL_BASED_OUTPATIENT_CLINIC_OR_DEPARTMENT_OTHER): Payer: Self-pay

## 2022-10-26 ENCOUNTER — Ambulatory Visit (HOSPITAL_BASED_OUTPATIENT_CLINIC_OR_DEPARTMENT_OTHER): Admission: RE | Admit: 2022-10-26 | Payer: Self-pay | Source: Ambulatory Visit

## 2022-10-26 ENCOUNTER — Ambulatory Visit (HOSPITAL_BASED_OUTPATIENT_CLINIC_OR_DEPARTMENT_OTHER)
Admission: RE | Admit: 2022-10-26 | Discharge: 2022-10-26 | Disposition: A | Discharge status: Home / Self Care | Payer: Self-pay | Source: Ambulatory Visit | Attending: Family Medicine | Admitting: Family Medicine

## 2022-10-26 DIAGNOSIS — R1031 Right lower quadrant pain: Secondary | ICD-10-CM | POA: Insufficient documentation

## 2022-10-26 DIAGNOSIS — R109 Unspecified abdominal pain: Secondary | ICD-10-CM | POA: Insufficient documentation

## 2022-10-26 DIAGNOSIS — R319 Hematuria, unspecified: Secondary | ICD-10-CM | POA: Insufficient documentation

## 2022-10-26 DIAGNOSIS — Z87442 Personal history of urinary calculi: Secondary | ICD-10-CM | POA: Insufficient documentation

## 2022-10-26 DIAGNOSIS — R1013 Epigastric pain: Secondary | ICD-10-CM | POA: Insufficient documentation

## 2022-10-26 MED ORDER — IOHEXOL 300 MG/ML  SOLN
100.0000 mL | Freq: Once | INTRAMUSCULAR | Status: AC | PRN
  Administered 2022-10-26: 85 mL via INTRAVENOUS

## 2022-11-10 ENCOUNTER — Other Ambulatory Visit: Payer: Self-pay

## 2022-11-10 ENCOUNTER — Emergency Department (HOSPITAL_COMMUNITY)
Admission: EM | Admit: 2022-11-10 | Discharge: 2022-11-10 | Disposition: A | Discharge status: Home / Self Care | Payer: Self-pay | Attending: Emergency Medicine | Admitting: Emergency Medicine

## 2022-11-10 ENCOUNTER — Emergency Department (HOSPITAL_COMMUNITY): Payer: Self-pay

## 2022-11-10 ENCOUNTER — Encounter (HOSPITAL_COMMUNITY): Payer: Self-pay

## 2022-11-10 DIAGNOSIS — R109 Unspecified abdominal pain: Secondary | ICD-10-CM

## 2022-11-10 DIAGNOSIS — R1011 Right upper quadrant pain: Secondary | ICD-10-CM | POA: Insufficient documentation

## 2022-11-10 DIAGNOSIS — R112 Nausea with vomiting, unspecified: Secondary | ICD-10-CM | POA: Insufficient documentation

## 2022-11-10 HISTORY — DX: Hematuria, unspecified: R31.9

## 2022-11-10 HISTORY — DX: Calculus of kidney: N20.0

## 2022-11-10 LAB — CBC WITH DIFFERENTIAL/PLATELET
Abs Immature Granulocytes: 0.01 10*3/uL (ref 0.00–0.07)
Basophils Absolute: 0 10*3/uL (ref 0.0–0.1)
Basophils Relative: 0 %
Eosinophils Absolute: 0.5 10*3/uL (ref 0.0–0.5)
Eosinophils Relative: 5 %
HCT: 43.6 % (ref 39.0–52.0)
Hemoglobin: 15 g/dL (ref 13.0–17.0)
Immature Granulocytes: 0 %
Lymphocytes Relative: 22 %
Lymphs Abs: 2 10*3/uL (ref 0.7–4.0)
MCH: 29 pg (ref 26.0–34.0)
MCHC: 34.4 g/dL (ref 30.0–36.0)
MCV: 84.2 fL (ref 80.0–100.0)
Monocytes Absolute: 0.5 10*3/uL (ref 0.1–1.0)
Monocytes Relative: 6 %
Neutro Abs: 6.2 10*3/uL (ref 1.7–7.7)
Neutrophils Relative %: 67 %
Platelets: 265 10*3/uL (ref 150–400)
RBC: 5.18 MIL/uL (ref 4.22–5.81)
RDW: 12.8 % (ref 11.5–15.5)
WBC: 9.2 10*3/uL (ref 4.0–10.5)
nRBC: 0 % (ref 0.0–0.2)

## 2022-11-10 LAB — MAGNESIUM: Magnesium: 2.2 mg/dL (ref 1.7–2.4)

## 2022-11-10 LAB — COMPREHENSIVE METABOLIC PANEL
ALT: 31 U/L (ref 0–44)
AST: 26 U/L (ref 15–41)
Albumin: 3.9 g/dL (ref 3.5–5.0)
Alkaline Phosphatase: 60 U/L (ref 38–126)
Anion gap: 8 (ref 5–15)
BUN: 20 mg/dL (ref 6–20)
CO2: 24 mmol/L (ref 22–32)
Calcium: 8.6 mg/dL — ABNORMAL LOW (ref 8.9–10.3)
Chloride: 104 mmol/L (ref 98–111)
Creatinine, Ser: 0.78 mg/dL (ref 0.61–1.24)
GFR, Estimated: >60 mL/min (ref >60)
Glucose, Bld: 106 mg/dL — ABNORMAL HIGH (ref 70–99)
Potassium: 3.9 mmol/L (ref 3.5–5.1)
Sodium: 136 mmol/L (ref 135–145)
Total Bilirubin: 2.1 mg/dL — ABNORMAL HIGH (ref 0.3–1.2)
Total Protein: 7 g/dL (ref 6.5–8.1)

## 2022-11-10 LAB — URINALYSIS, ROUTINE W REFLEX MICROSCOPIC
Bilirubin Urine: NEGATIVE
Glucose, UA: NEGATIVE mg/dL
Hgb urine dipstick: NEGATIVE
Ketones, ur: NEGATIVE mg/dL
Leukocytes,Ua: NEGATIVE
Nitrite: NEGATIVE
Protein, ur: NEGATIVE mg/dL
Specific Gravity, Urine: 1.009 (ref 1.005–1.030)
pH: 7 (ref 5.0–8.0)

## 2022-11-10 LAB — LIPASE, BLOOD: Lipase: 31 U/L (ref 11–51)

## 2022-11-10 MED ORDER — IOHEXOL 350 MG/ML SOLN
75.0000 mL | Freq: Once | INTRAVENOUS | Status: AC | PRN
  Administered 2022-11-10: 75 mL via INTRAVENOUS

## 2022-11-10 MED ORDER — HYDROMORPHONE HCL 1 MG/ML IJ SOLN
0.5000 mg | Freq: Once | INTRAMUSCULAR | Status: AC
  Administered 2022-11-10: 0.5 mg via INTRAVENOUS
  Filled 2022-11-10: qty 1

## 2022-11-10 MED ORDER — DICYCLOMINE HCL 10 MG PO CAPS
10.0000 mg | ORAL_CAPSULE | Freq: Once | ORAL | Status: AC
  Administered 2022-11-10: 10 mg via ORAL
  Filled 2022-11-10: qty 1

## 2022-11-10 MED ORDER — ONDANSETRON HCL 4 MG/2ML IJ SOLN
4.0000 mg | Freq: Once | INTRAMUSCULAR | Status: AC
  Administered 2022-11-10: 4 mg via INTRAVENOUS
  Filled 2022-11-10: qty 2

## 2022-11-10 MED ORDER — DICYCLOMINE HCL 10 MG PO CAPS: 10.0000 mg | ORAL_CAPSULE | Freq: Once | ORAL | Status: DC

## 2022-11-10 MED ORDER — ONDANSETRON 4 MG PO TBDP: 4.0000 mg | ORAL_TABLET | Freq: Three times a day (TID) | ORAL | 0 refills | Status: AC | PRN

## 2022-11-10 MED ORDER — DICYCLOMINE HCL 10 MG PO CAPS: 10.0000 mg | ORAL_CAPSULE | Freq: Three times a day (TID) | ORAL | 0 refills | Status: AC | PRN

## 2022-11-10 NOTE — ED Provider Notes (Signed)
EMERGENCY DEPARTMENT AT [facility] Provider Note   CSN: [id number] Arrival date & time: 11/10/22  0815     History  Chief Complaint  Patient presents with   Abdominal Pain   Emesis   Diarrhea    James Arias is a 42 y.o. male.  The history is provided by the patient. The history is limited by a language barrier. A language interpreter was used.  Abdominal Pain Associated symptoms: diarrhea and vomiting   Emesis Associated symptoms: abdominal pain and diarrhea   Diarrhea Associated symptoms: abdominal pain and vomiting   Patient presents for abdominal pain.  He has no known chronic medical conditions.  Over the past month, he has had intermittent right upper quadrant abdominal pain.  He was seen by PCP for this.  He underwent a CT scan 2 weeks ago which showed mild dilation of proximal appendix.  He was prescribed ciprofloxacin an PPI.  This morning, while at work, patient had worsening of his right upper quadrant pain.  He did have associated nausea and did have a small amount of vomitus.  For this reason, he presents to the ED this morning.  Current pain is 7-8/10 in severity.  His nausea is currently resolved.  He denies any other current complaints.  He denies any dysuria.  He has had normal bowel movements.      Home Medications Prior to Admission medications   Medication Sig Start Date End Date Taking? Authorizing Provider  dicyclomine (BENTYL) 10 MG capsule Take 1 capsule (10 mg total) by mouth 3 (three) times daily as needed for spasms (abdominal pain). 11/10/22  Yes Gloris Manchester, MD  ondansetron (ZOFRAN-ODT) 4 MG disintegrating tablet Take 1 tablet (4 mg total) by mouth every 8 (eight) hours as needed for nausea or vomiting. 11/10/22  Yes Gloris Manchester, MD  cyclobenzaprine (FLEXERIL) 5 MG tablet Take 1-2 tablets (5-10 mg total) by mouth 3 (three) times daily as needed for muscle spasms. Patient not taking: No sig reported 07/09/15   Wallis Bamberg,  PA-C  esomeprazole (NEXIUM) 20 MG packet Take 20 mg by mouth daily before breakfast. Patient not taking: No sig reported 12/05/14   Wallis Bamberg, PA-C  meloxicam (MOBIC) 7.5 MG tablet Take 1-2 tablets (7.5-15 mg total) by mouth daily. Patient not taking: No sig reported 07/09/15   Wallis Bamberg, PA-C  omeprazole (PRILOSEC) 20 MG capsule Take 1 capsule (20 mg total) by mouth daily. Patient not taking: No sig reported 02/08/18   Georgina Quint, MD  ondansetron (ZOFRAN ODT) 4 MG disintegrating tablet Take 1 tablet (4 mg total) by mouth every 8 (eight) hours as needed for nausea or vomiting. 05/30/20   Khatri, Hina, PA-C  oxyCODONE-acetaminophen (PERCOCET/ROXICET) 5-325 MG tablet Take 1 tablet by mouth every 8 (eight) hours as needed for severe pain. 05/30/20   Khatri, Hina, PA-C  ranitidine (ZANTAC) 150 MG tablet Take 1 tablet (150 mg total) by mouth 2 (two) times daily. Patient not taking: No sig reported 02/16/18   Eyvonne Mechanic, PA-C      Allergies    Patient has no known allergies.    Review of Systems   Review of Systems  Gastrointestinal:  Positive for abdominal pain, diarrhea and vomiting.  All other systems reviewed and are negative.   Physical Exam Updated Vital Signs BP 121/78   Pulse (!) 59   Temp (!) 97.5 F (36.4 C) (Oral)   Resp 15   Ht 5\' 5"  (1.651 m)   Wt  80.3 kg   SpO2 97%   BMI 29.45 kg/m  Physical Exam Vitals and nursing note reviewed.  Constitutional:      General: He is not in acute distress.    Appearance: He is well-developed. He is not ill-appearing, toxic-appearing or diaphoretic.  HENT:     Head: Normocephalic and atraumatic.     Mouth/Throat:     Mouth: Mucous membranes are moist.  Eyes:     Extraocular Movements: Extraocular movements intact.     Conjunctiva/sclera: Conjunctivae normal.  Cardiovascular:     Rate and Rhythm: Normal rate and regular rhythm.  Pulmonary:     Effort: Pulmonary effort is normal. No respiratory distress.  Abdominal:      Palpations: Abdomen is soft.     Tenderness: There is abdominal tenderness in the right upper quadrant. There is no guarding or rebound.  Musculoskeletal:        General: No swelling.     Cervical back: Neck supple.  Skin:    General: Skin is warm and dry.     Capillary Refill: Capillary refill takes less than 2 seconds.  Neurological:     General: No focal deficit present.     Mental Status: He is alert and oriented to person, place, and time.  Psychiatric:        Mood and Affect: Mood normal.        Behavior: Behavior normal.     ED Results / Procedures / Treatments   Labs (all labs ordered are listed, but only abnormal results are displayed) Labs Reviewed  COMPREHENSIVE METABOLIC PANEL - Abnormal; Notable for the following components:      Result Value   Glucose, Bld 106 (*)    Calcium 8.6 (*)    Total Bilirubin 2.1 (*)    All other components within normal limits  URINALYSIS, ROUTINE W REFLEX MICROSCOPIC - Abnormal; Notable for the following components:   Color, Urine STRAW (*)    All other components within normal limits  LIPASE, BLOOD  CBC WITH DIFFERENTIAL/PLATELET  MAGNESIUM    EKG EKG Interpretation  Date/Time:  Tuesday November 10 2022 09:05:37 EDT Ventricular Rate:  62 PR Interval:  128 QRS Duration: 108 QT Interval:  397 QTC Calculation: 404 R Axis:   23 Text Interpretation: Sinus rhythm RSR' in V1 or V2, right VCD or RVH Confirmed by Gloris Manchester [phone]) on 11/10/2022 9:55:23 AM  Radiology CT ABDOMEN PELVIS W CONTRAST  Result Date: 11/10/2022 CLINICAL DATA:  Abdominal pain right upper quadrant and nausea and vomiting and diarrhea for a month. EXAM: CT ABDOMEN AND PELVIS WITH CONTRAST TECHNIQUE: Multidetector CT imaging of the abdomen and pelvis was performed using the standard protocol following bolus administration of intravenous contrast. RADIATION DOSE REDUCTION: This exam was performed according to the departmental dose-optimization program which  includes automated exposure control, adjustment of the mA and/or kV according to patient size and/or use of iterative reconstruction technique. CONTRAST:  75mL OMNIPAQUE IOHEXOL 350 MG/ML SOLN COMPARISON:  CT 10/26/2022. Ultrasound 11/10/2022 dictated separately FINDINGS: Lower chest: Slight linear opacity right lung base likely scar or atelectasis. No pleural effusion. Hepatobiliary: No focal liver abnormality is seen. No gallstones, gallbladder wall thickening, or biliary dilatation. Pancreas: Unremarkable. No pancreatic ductal dilatation or surrounding inflammatory changes. Spleen: Normal in size without focal abnormality. Adrenals/Urinary Tract: Adrenal glands are unremarkable. Kidneys are normal, without renal calculi, focal lesion, or hydronephrosis. Bladder is unremarkable. Stomach/Bowel: No oral contrast. Stomach is nondilated. Small bowel is nondilated. Large bowel has  a normal course and caliber with mild scattered colonic stool. Appendix extends posterior and medial to the cecum in the right lower quadrant has some luminal high density. Diameter of the appendix today of 8 mm. Appearance is similar to prior. No adjacent inflammatory changes. Vascular/Lymphatic: Normal caliber aorta and IVC. Circumaortic left renal vein. No abnormal lymph node enlargement identified in the abdomen and pelvis. Reproductive: Prostate is unremarkable. Other: Surgical changes along the right inguinal region. No free air or free fluid. Musculoskeletal: No acute or significant osseous findings. IMPRESSION: Once again the appendix measures 8 mm in diameter with some luminal high density. No wall thickening or inflammatory changes. No bowel obstruction, free air or free fluid. Electronically Signed   By: Karen Kays M.D.   On: 11/10/2022 11:10   US Abdomen Limited  Result Date: 11/10/2022 CLINICAL DATA:  Right upper quadrant pain EXAM: ULTRASOUND ABDOMEN LIMITED RIGHT UPPER QUADRANT COMPARISON:  CT 10/26/2022, ultrasound  02/16/2018 FINDINGS: Gallbladder: No gallstones or wall thickening visualized. No sonographic Murphy sign noted by sonographer. Common bile duct: Diameter: 3 mm Liver: No focal lesion identified. Within normal limits in parenchymal echogenicity. Portal vein is patent on color Doppler imaging with normal direction of blood flow towards the liver. Other: None. IMPRESSION: No gallstones or ductal dilatation. Electronically Signed   By: Karen Kays M.D.   On: 11/10/2022 10:19    Procedures Procedures    Medications Ordered in ED Medications  ondansetron (ZOFRAN) injection 4 mg (4 mg Intravenous Given 11/10/22 0920)  HYDROmorphone (DILAUDID) injection 0.5 mg (0.5 mg Intravenous Given 11/10/22 0920)  iohexol (OMNIPAQUE) 350 MG/ML injection 75 mL (75 mLs Intravenous Contrast Given 11/10/22 1036)  dicyclomine (BENTYL) capsule 10 mg (10 mg Oral Given 11/10/22 1418)    ED Course/ Medical Decision Making/ A&P                             Medical Decision Making Amount and/or Complexity of Data Reviewed Labs: ordered. Radiology: ordered.  Risk Prescription drug management.   This patient presents to the ED for concern of abdominal pain, this involves an extensive number of treatment options, and is a complaint that carries with it a high risk of complications and morbidity.  The differential diagnosis includes appendicitis, colitis, IBD, nephrolithiasis, cholecystitis, cholelithiasis   Co morbidities that complicate the patient evaluation  N/A   Additional history obtained:  Additional history obtained from N/A External records from outside source obtained and reviewed including EMR   Lab Tests:  I Ordered, and personally interpreted labs.  The pertinent results include: Normal hemoglobin, no leukocytosis, normal electrolytes.  Total bilirubin is very slightly elevated but hepatobiliary enzymes are otherwise normal.  No evidence of UTI or hematuria on urinalysis.   Imaging Studies  ordered:  I ordered imaging studies including right upper quadrant ultrasound and CT of abdomen and pelvis I independently visualized and interpreted imaging which showed no acute findings I agree with the radiologist interpretation   Cardiac Monitoring: / EKG:  The patient was maintained on a cardiac monitor.  I personally viewed and interpreted the cardiac monitored which showed an underlying rhythm of: Sinus rhythm   Consultations Obtained:  I requested consultation with the general surgery,  and discussed lab and imaging findings as well as pertinent plan - they recommend: Unlikely to be related to appendix.  No indication for surgery at this time.   Problem List / ED Course / Critical interventions /  Medication management  Patient presents for right upper quadrant abdominal pain.  This has been present intermittently over the past month.  It is slightly worsened postprandially but patient describes worse pain when he does not eat.  Vital signs on arrival are normal.  On exam, patient has right upper quadrant pain and tenderness.  Current pain is 7-8/10 in severity.  Medications were ordered for symptomatic relief.  Workup was initiated.  Patient's lab work was unremarkable.  Right upper quadrant ultrasound showed normal gallbladder and liver findings.  CT scan of abdomen and pelvis did not show any acute findings.  There was no evidence of nephrolithiasis.  Appendix appears unchanged from prior CT imaging dating back for the last 2 years.  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  They did graciously come and evaluate the patient.  They do not feel this is related to his appendix.  Patient did have improved symptoms while in the ED.  He may have some chronic intermittent ileocecal inflammation.  He would benefit from gastroenterology follow-up.  He was prescribed Bentyl and Zofran to take at home as needed.  He was discharged in stable  condition. I ordered medication including Zofran for nausea; Dilaudid and Bentyl for analgesia Reevaluation of the patient after these medicines showed that the patient improved I have reviewed the patients home medicines and have made adjustments as needed   Social Determinants of Health:  Has access to outpatient care, very limited English speaking ability         Final Clinical Impression(s) / ED Diagnoses Final diagnoses:  Abdominal pain, right lateral    Rx / DC Orders ED Discharge Orders          Ordered    dicyclomine (BENTYL) 10 MG capsule  3 times daily PRN        11/10/22 1348    ondansetron (ZOFRAN-ODT) 4 MG disintegrating tablet  Every 8 hours PRN        11/10/22 1348              Gloris Manchester, MD 11/10/22 1425

## 2022-11-10 NOTE — ED Triage Notes (Addendum)
Pt c/o RUQ pain and n/v/d x1 month.  Pt has been seen by PCP for same and referred to [facility].  Sts he is waiting for them to call him w/ a surgery date to remove appendix.  Pain score 8/10.  Pt was prescribed Cipro but stopped taking it because it made him feel bad.   Interpreter:  Casimiro Needle [phone]

## 2022-11-10 NOTE — Discharge Instructions (Signed)
Your appendix appears unchanged over the past 2 years.  Take Tylenol and ibuprofen as needed for pain.  A prescription for a medication to treat nausea, as well as a prescription for medication to treat abdominal pain were sent to your pharmacy.  Take this as needed.  You would benefit from establishing care with a gastroenterologist.  Telephone number is below.  Call this number to set up a follow-up appointment.  Continue to follow-up with your primary care doctor.  Return to the emergency department for any new or worsening symptoms of concern.

## 2022-11-10 NOTE — Consult Note (Signed)
Debby Bud [birth year]  [id number].    Requesting MD: Dr. Gloris Manchester Chief Complaint/Reason for Consult: abnormal appendix, abdominal pain  HPI:  This is a very pleasant 42 year old Hispanic male with a history of nephrolithiasis who began having some right upper quadrant abdominal pain about a month ago.  He denies any nausea or vomiting except for minimal nausea at this morning.  He has been eating well for the last month.  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  He states that nothing otherwise tends to make his pain worse or better.  He states that he moves his bowels regularly.  He did have about a week of diarrhea however this coincided with the oral contrast he received for his CT scan earlier this month.  He saw his primary care physician earlier this month who sent him for that CT scan.  This revealed an 8 mm appendix with no surrounding inflammatory changes but stable from his last CT scan in 2022.  An outpatient referral was made to our office to discuss laparoscopic appendectomy.  However, he was told to go to the emergency room if he had worsening pain.  Of note, he has also been found to have hematuria and is scheduled on June 27 for evaluation of his kidneys.  He does state that this pain is similar to the last time that he had kidney stones.  The patient began to have some acutely worsening pain this morning.  Again he denies any vomiting or diarrhea.  He has not had any fevers or chills.  He denies any dysuria or obvious hematuria.  He presents to the emergency department for evaluation.  His labs are all unremarkable except for a chronically elevated total bilirubin around 2 that has been elevated for at least the last 7 years in our chart.  He also has a repeat CT scan with similar findings of the appendix from his scan earlier this month and from his scan in 2022.  He also underwent an abdominal ultrasound that  revealed no findings of gallstones or biliary pathology.  We have been asked to evaluate the patient for further recommendations.  ROS: ROS see HPI  History reviewed. No pertinent family history.  Past Medical History:  Diagnosis Date   Hematuria    Nephrolithiasis     Past Surgical History:  Procedure Laterality Date   right inguinal hernia      Social History:  reports that he has never smoked. He has never used smokeless tobacco. He reports that he does not drink alcohol and does not use drugs.  Allergies: No Known Allergies  (Not in a hospital admission)    Physical Exam: Blood pressure 121/78, pulse (!) 59, temperature (!) 97.5 F (36.4 C), temperature source Oral, resp. rate 15, height 5\' 5"  (1.651 m), weight 80.3 kg, SpO2 97 %. General: pleasant, WD, WN Hispanic male who is laying in bed in NAD HEENT: head is normocephalic, atraumatic.  Sclera are noninjected.  PERRL.  Ears and nose without any masses or lesions.  Mouth is pink and moist Heart: regular, rate, and rhythm.  Normal s1,s2. No obvious murmurs, gallops, or rubs noted.  Palpable radial and pedal pulses bilaterally Lungs: CTAB, no wheezes, rhonchi, or rales noted.  Respiratory effort nonlabored Abd: soft, mildly tender in the right upper quadrant with no guarding or rebounding, no CVA tenderness bilaterally, he is completely nontender in his right  lower quadrant, ND, +BS, no masses, hernias, or organomegaly MS: all 4 extremities are symmetrical with no cyanosis, clubbing, or edema. Psych: A&Ox3 with an appropriate affect.   Results for orders placed or performed during the hospital encounter of 11/10/22 (from the past 48 hour(s))  Comprehensive metabolic panel     Status: Abnormal   Collection Time: 11/10/22  9:15 AM  Result Value Ref Range   Sodium 136 135 - 145 mmol/L   Potassium 3.9 3.5 - 5.1 mmol/L    Comment: HEMOLYSIS AT THIS LEVEL MAY AFFECT RESULT   Chloride 104 98 - 111 mmol/L   CO2 24 22 - 32  mmol/L   Glucose, Bld 106 (H) 70 - 99 mg/dL    Comment: Glucose reference range applies only to samples taken after fasting for at least 8 hours.   BUN 20 6 - 20 mg/dL   Creatinine, Ser 4.01 0.61 - 1.24 mg/dL   Calcium 8.6 (L) 8.9 - 10.3 mg/dL   Total Protein 7.0 6.5 - 8.1 g/dL   Albumin 3.9 3.5 - 5.0 g/dL   AST 26 15 - 41 U/L    Comment: HEMOLYSIS AT THIS LEVEL MAY AFFECT RESULT   ALT 31 0 - 44 U/L    Comment: HEMOLYSIS AT THIS LEVEL MAY AFFECT RESULT   Alkaline Phosphatase 60 38 - 126 U/L   Total Bilirubin 2.1 (H) 0.3 - 1.2 mg/dL    Comment: HEMOLYSIS AT THIS LEVEL MAY AFFECT RESULT   GFR, Estimated >60 >60 mL/min    Comment: (NOTE) Calculated using the CKD-EPI Creatinine Equation (2021)    Anion gap 8 5 - 15    Comment: Performed at [facility], [address] [place], [place] [address]  Lipase, blood     Status: None   Collection Time: 11/10/22  9:15 AM  Result Value Ref Range   Lipase 31 11 - 51 U/L    Comment: Performed at [facility], [address] [place], [place] [address]  CBC with Diff     Status: None   Collection Time: 11/10/22  9:15 AM  Result Value Ref Range   WBC 9.2 4.0 - 10.5 K/uL   RBC 5.18 4.22 - 5.81 MIL/uL   Hemoglobin 15.0 13.0 - 17.0 g/dL   HCT 03.4 74.2 - 59.5 %   MCV 84.2 80.0 - 100.0 fL   MCH 29.0 26.0 - 34.0 pg   MCHC 34.4 30.0 - 36.0 g/dL   RDW 63.8 75.6 - 43.3 %   Platelets 265 150 - 400 K/uL   nRBC 0.0 0.0 - 0.2 %   Neutrophils Relative % 67 %   Neutro Abs 6.2 1.7 - 7.7 K/uL   Lymphocytes Relative 22 %   Lymphs Abs 2.0 0.7 - 4.0 K/uL   Monocytes Relative 6 %   Monocytes Absolute 0.5 0.1 - 1.0 K/uL   Eosinophils Relative 5 %   Eosinophils Absolute 0.5 0.0 - 0.5 K/uL   Basophils Relative 0 %   Basophils Absolute 0.0 0.0 - 0.1 K/uL   Immature Granulocytes 0 %   Abs Immature Granulocytes 0.01 0.00 - 0.07 K/uL    Comment: Performed at [facility], [address], [place], [place] [address]  Magnesium     Status:  None   Collection Time: 11/10/22  9:15 AM  Result Value Ref Range   Magnesium 2.2 1.7 - 2.4 mg/dL    Comment: Performed at [facility], [address], [place], [place] [address]  Urinalysis, Routine w reflex microscopic -Urine, Clean Catch     Status: Abnormal   Collection Time: 11/10/22  9:27 AM  Result Value Ref Range   Color, Urine STRAW (A) YELLOW   APPearance CLEAR CLEAR   Specific Gravity, Urine 1.009 1.005 - 1.030   pH 7.0 5.0 - 8.0   Glucose, UA NEGATIVE NEGATIVE mg/dL   Hgb urine dipstick NEGATIVE NEGATIVE   Bilirubin Urine NEGATIVE NEGATIVE   Ketones, ur NEGATIVE NEGATIVE mg/dL   Protein, ur NEGATIVE NEGATIVE mg/dL   Nitrite NEGATIVE NEGATIVE   Leukocytes,Ua NEGATIVE NEGATIVE    Comment: Performed at [facility], [address], [place], [place] [address]   CT ABDOMEN PELVIS W CONTRAST  Result Date: 11/10/2022 CLINICAL DATA:  Abdominal pain right upper quadrant and nausea and vomiting and diarrhea for a month. EXAM: CT ABDOMEN AND PELVIS WITH CONTRAST TECHNIQUE: Multidetector CT imaging of the abdomen and pelvis was performed using the standard protocol following bolus administration of intravenous contrast. RADIATION DOSE REDUCTION: This exam was performed according to the departmental dose-optimization program which includes automated exposure control, adjustment of the mA and/or kV according to patient size and/or use of iterative reconstruction technique. CONTRAST:  75mL OMNIPAQUE IOHEXOL 350 MG/ML SOLN COMPARISON:  CT 10/26/2022. Ultrasound 11/10/2022 dictated separately FINDINGS: Lower chest: Slight linear opacity right lung base likely scar or atelectasis. No pleural effusion. Hepatobiliary: No focal liver abnormality is seen. No gallstones, gallbladder wall thickening, or biliary dilatation. Pancreas: Unremarkable. No pancreatic ductal dilatation or surrounding inflammatory changes. Spleen: Normal in size without focal abnormality. Adrenals/Urinary Tract:  Adrenal glands are unremarkable. Kidneys are normal, without renal calculi, focal lesion, or hydronephrosis. Bladder is unremarkable. Stomach/Bowel: No oral contrast. Stomach is nondilated. Small bowel is nondilated. Large bowel has a normal course and caliber with mild scattered colonic stool. Appendix extends posterior and medial to the cecum in the right lower quadrant has some luminal high density. Diameter of the appendix today of 8 mm. Appearance is similar to prior. No adjacent inflammatory changes. Vascular/Lymphatic: Normal caliber aorta and IVC. Circumaortic left renal vein. No abnormal lymph node enlargement identified in the abdomen and pelvis. Reproductive: Prostate is unremarkable. Other: Surgical changes along the right inguinal region. No free air or free fluid. Musculoskeletal: No acute or significant osseous findings. IMPRESSION: Once again the appendix measures 8 mm in diameter with some luminal high density. No wall thickening or inflammatory changes. No bowel obstruction, free air or free fluid. Electronically Signed   By: Karen Kays M.D.   On: 11/10/2022 11:10   US Abdomen Limited  Result Date: 11/10/2022 CLINICAL DATA:  Right upper quadrant pain EXAM: ULTRASOUND ABDOMEN LIMITED RIGHT UPPER QUADRANT COMPARISON:  CT 10/26/2022, ultrasound 02/16/2018 FINDINGS: Gallbladder: No gallstones or wall thickening visualized. No sonographic Murphy sign noted by sonographer. Common bile duct: Diameter: 3 mm Liver: No focal lesion identified. Within normal limits in parenchymal echogenicity. Portal vein is patent on color Doppler imaging with normal direction of blood flow towards the liver. Other: None. IMPRESSION: No gallstones or ductal dilatation. Electronically Signed   By: Karen Kays M.D.   On: 11/10/2022 10:19      Assessment/Plan Right upper quadrant/right flank pain The patient has been seen, examined, chart, labs, vitals, all pertinent imaging reviewed.  The patient's history is not  consistent with appendicitis given the chronicity as well as location and symptoms.  The patient's pain has been more his right upper quadrant and right flank.  He actually states that his pain is somewhat similar to the last time that he was diagnosed with nephrolithiasis.  He denies any fevers or GI symptoms consistent with appendicitis.  His appendix on his CT scan does not show any surrounding inflammation.  His dilatation of 8 mm at the base is chronic and has been seen on his imaging since at least 2022.  Given the overall stability in this, we do not feel this is consistent with appendicitis.  The patient did undergo abdominal ultrasound to rule out biliary disease.  This was negative.  Both of his most recent CT scans have been with contrast.  His UA is negative however, it could be reasonable to repeat a CT scan without contrast to rule out nephrolithiasis as an etiology.  If this is negative, it may be worthwhile for the patient to have outpatient gastroenterology follow-up to make sure that he does not have some other type of problem such as peptic ulcer disease etc.  I thoroughly discussed this explanation with the patient and his family members via an interpreter.  They understand what is going on and all questions are answered.  I also discussed the situation thoroughly with the ED provider as well.  Nothing further to offer at this time from a surgical standpoint.   I reviewed ED provider notes, last 24 h vitals and pain scores, last 48 h intake and output, last 24 h labs and trends, and last 24 h imaging results.  Letha Cape, [facility] 11/10/2022, 3:04 PM Please see Amion for pager number during day hours 7:00am-4:30pm or 7:00am -11:30am on weekends

## 2022-11-19 ENCOUNTER — Ambulatory Visit (HOSPITAL_BASED_OUTPATIENT_CLINIC_OR_DEPARTMENT_OTHER): Admission: RE | Admit: 2022-11-19 | Payer: Self-pay | Source: Ambulatory Visit

## 2022-11-19 ENCOUNTER — Encounter (HOSPITAL_BASED_OUTPATIENT_CLINIC_OR_DEPARTMENT_OTHER): Payer: Self-pay

## 2022-11-20 ENCOUNTER — Encounter (HOSPITAL_COMMUNITY): Payer: Self-pay | Admitting: *Deleted

## 2022-11-20 ENCOUNTER — Emergency Department (HOSPITAL_COMMUNITY): Payer: Self-pay

## 2022-11-20 ENCOUNTER — Other Ambulatory Visit: Payer: Self-pay

## 2022-11-20 ENCOUNTER — Emergency Department (HOSPITAL_COMMUNITY)
Admission: EM | Admit: 2022-11-20 | Discharge: 2022-11-20 | Disposition: A | Discharge status: Home / Self Care | Payer: Self-pay | Attending: Emergency Medicine | Admitting: Emergency Medicine

## 2022-11-20 DIAGNOSIS — R1031 Right lower quadrant pain: Secondary | ICD-10-CM | POA: Insufficient documentation

## 2022-11-20 LAB — URINALYSIS, ROUTINE W REFLEX MICROSCOPIC
Bilirubin Urine: NEGATIVE
Glucose, UA: NEGATIVE mg/dL
Ketones, ur: NEGATIVE mg/dL
Leukocytes,Ua: NEGATIVE
Nitrite: NEGATIVE
Protein, ur: NEGATIVE mg/dL
Specific Gravity, Urine: 1.018 (ref 1.005–1.030)
pH: 5 (ref 5.0–8.0)

## 2022-11-20 LAB — CBC WITH DIFFERENTIAL/PLATELET
Abs Immature Granulocytes: 0.01 10*3/uL (ref 0.00–0.07)
Basophils Absolute: 0 10*3/uL (ref 0.0–0.1)
Basophils Relative: 0 %
Eosinophils Absolute: 0.4 10*3/uL (ref 0.0–0.5)
Eosinophils Relative: 4 %
HCT: 44.2 % (ref 39.0–52.0)
Hemoglobin: 15.3 g/dL (ref 13.0–17.0)
Immature Granulocytes: 0 %
Lymphocytes Relative: 21 %
Lymphs Abs: 2.1 10*3/uL (ref 0.7–4.0)
MCH: 29.5 pg (ref 26.0–34.0)
MCHC: 34.6 g/dL (ref 30.0–36.0)
MCV: 85.2 fL (ref 80.0–100.0)
Monocytes Absolute: 0.6 10*3/uL (ref 0.1–1.0)
Monocytes Relative: 6 %
Neutro Abs: 6.8 10*3/uL (ref 1.7–7.7)
Neutrophils Relative %: 69 %
Platelets: 264 10*3/uL (ref 150–400)
RBC: 5.19 MIL/uL (ref 4.22–5.81)
RDW: 12.6 % (ref 11.5–15.5)
WBC: 9.9 10*3/uL (ref 4.0–10.5)
nRBC: 0 % (ref 0.0–0.2)

## 2022-11-20 LAB — COMPREHENSIVE METABOLIC PANEL
ALT: 29 U/L (ref 0–44)
AST: 20 U/L (ref 15–41)
Albumin: 4 g/dL (ref 3.5–5.0)
Alkaline Phosphatase: 58 U/L (ref 38–126)
Anion gap: 8 (ref 5–15)
BUN: 18 mg/dL (ref 6–20)
CO2: 23 mmol/L (ref 22–32)
Calcium: 8.7 mg/dL — ABNORMAL LOW (ref 8.9–10.3)
Chloride: 107 mmol/L (ref 98–111)
Creatinine, Ser: 0.75 mg/dL (ref 0.61–1.24)
GFR, Estimated: >60 mL/min (ref >60)
Glucose, Bld: 112 mg/dL — ABNORMAL HIGH (ref 70–99)
Potassium: 3.5 mmol/L (ref 3.5–5.1)
Sodium: 138 mmol/L (ref 135–145)
Total Bilirubin: 2.3 mg/dL — ABNORMAL HIGH (ref 0.3–1.2)
Total Protein: 7.3 g/dL (ref 6.5–8.1)

## 2022-11-20 LAB — LIPASE, BLOOD: Lipase: 29 U/L (ref 11–51)

## 2022-11-20 MED ORDER — IOHEXOL 350 MG/ML SOLN
75.0000 mL | Freq: Once | INTRAVENOUS | Status: AC | PRN
  Administered 2022-11-20: 75 mL via INTRAVENOUS

## 2022-11-20 MED ORDER — IBUPROFEN 600 MG PO TABS: 600.0000 mg | ORAL_TABLET | Freq: Four times a day (QID) | ORAL | 0 refills | Status: AC | PRN

## 2022-11-20 MED ORDER — MORPHINE SULFATE (PF) 4 MG/ML IV SOLN
4.0000 mg | Freq: Once | INTRAVENOUS | Status: AC
  Administered 2022-11-20: 4 mg via INTRAVENOUS
  Filled 2022-11-20: qty 1

## 2022-11-20 NOTE — ED Triage Notes (Addendum)
BIB family from home for RLQ abd pain. Pt returns for continued appendicitis type pain. Pain fluctuates and is increasingly progressive and worse. Sx onset 5/16. CT scan on 6/03, and antibiotics started on 6/03 by his PCP, and referred to [facility] surgery. Pt seen by EDP and CCS with notes on 6/18. Pt states, he was suppose to have surgery on 6/19, but either missed or cancelled it based on what he was told in the ED on 6/18. There is a language barrier. Primary language is Spanish. Triage assisted by [employer] video interpreter [facility] [phone]. Last BM 1500. Last ate 1000. Denies NVD or fever.

## 2022-11-20 NOTE — ED Provider Notes (Signed)
[place] EMERGENCY DEPARTMENT AT [facility] Provider Note   CSN: [id number] Arrival date & time: 11/20/22  1732     History  Chief Complaint  Patient presents with   Abdominal Pain    James Arias is a 42 y.o. male with a past medical history significant for kidney stones and history of hematuria who presents to the ED due to intermittent right lower quadrant abdominal pain.  Most recent episode started earlier today.  Patient was advised by PCP to report to the ED due to concerns about an appendicitis.  Patient had a CT abdomen on 6/8 which showed possible early appendicitis.  At that time patient was prescribed ciprofloxacin and a PPI with improvement in right lower quadrant abdominal pain.  He then returned to the ED on 6/18 for continued right lower quadrant pain where he was evaluated by general surgery who did not feel his pain was related to his appendix.  Patient returns today due to concerns about appendicitis.  Denies nausea, vomiting, and diarrhea.  Notes his pain sometimes improves after urination.  No dysuria.  No concern for STIs.  Previous history of kidney stones which patient was able to pass on its own.  Denies recent hematuria however, notes roughly 1 month ago his PCP told him he had blood in his urine.  Previous hernia repair however, no other abdominal operations.  Denies testicular pain.  No penile discharge. No rash.   History obtained from patient and past medical records. No interpreter used during encounter.       Home Medications Prior to Admission medications   Medication Sig Start Date End Date Taking? Authorizing Provider  ibuprofen (ADVIL) 600 MG tablet Take 1 tablet (600 mg total) by mouth every 6 (six) hours as needed. 11/20/22  Yes Marchel Foote, Merla Riches, PA-C  cyclobenzaprine (FLEXERIL) 5 MG tablet Take 1-2 tablets (5-10 mg total) by mouth 3 (three) times daily as needed for muscle spasms. Patient not taking: No sig reported 07/09/15    Wallis Bamberg, PA-C  dicyclomine (BENTYL) 10 MG capsule Take 1 capsule (10 mg total) by mouth 3 (three) times daily as needed for spasms (abdominal pain). 11/10/22   Gloris Manchester, MD  esomeprazole (NEXIUM) 20 MG packet Take 20 mg by mouth daily before breakfast. Patient not taking: No sig reported 12/05/14   Wallis Bamberg, PA-C  meloxicam (MOBIC) 7.5 MG tablet Take 1-2 tablets (7.5-15 mg total) by mouth daily. Patient not taking: No sig reported 07/09/15   Wallis Bamberg, PA-C  omeprazole (PRILOSEC) 20 MG capsule Take 1 capsule (20 mg total) by mouth daily. Patient not taking: No sig reported 02/08/18   Georgina Quint, MD  ondansetron (ZOFRAN ODT) 4 MG disintegrating tablet Take 1 tablet (4 mg total) by mouth every 8 (eight) hours as needed for nausea or vomiting. 05/30/20   Khatri, Hina, PA-C  ondansetron (ZOFRAN-ODT) 4 MG disintegrating tablet Take 1 tablet (4 mg total) by mouth every 8 (eight) hours as needed for nausea or vomiting. 11/10/22   Gloris Manchester, MD  oxyCODONE-acetaminophen (PERCOCET/ROXICET) 5-325 MG tablet Take 1 tablet by mouth every 8 (eight) hours as needed for severe pain. 05/30/20   Khatri, Hina, PA-C  ranitidine (ZANTAC) 150 MG tablet Take 1 tablet (150 mg total) by mouth 2 (two) times daily. Patient not taking: No sig reported 02/16/18   Eyvonne Mechanic, PA-C      Allergies    Patient has no known allergies.    Review of Systems  Review of Systems  Respiratory:  Negative for shortness of breath.   Cardiovascular:  Negative for chest pain.  Gastrointestinal:  Positive for abdominal pain. Negative for diarrhea, nausea and vomiting.    Physical Exam Updated Vital Signs BP 120/79   Pulse (!) 53   Temp 97.6 F (36.4 C) (Oral)   Resp 17   Wt 80.3 kg   SpO2 98%   BMI 29.45 kg/m  Physical Exam Vitals and nursing note reviewed.  Constitutional:      General: He is not in acute distress.    Appearance: He is not ill-appearing.  HENT:     Head: Normocephalic.  Eyes:      Pupils: Pupils are equal, round, and reactive to light.  Cardiovascular:     Rate and Rhythm: Normal rate and regular rhythm.     Pulses: Normal pulses.     Heart sounds: Normal heart sounds. No murmur heard.    No friction rub. No gallop.  Pulmonary:     Effort: Pulmonary effort is normal.     Breath sounds: Normal breath sounds.  Abdominal:     General: Abdomen is flat. There is no distension.     Palpations: Abdomen is soft.     Tenderness: There is abdominal tenderness. There is no guarding or rebound.     Comments: RLQ tenderness without rebound or guarding  Musculoskeletal:        General: Normal range of motion.     Cervical back: Neck supple.  Skin:    General: Skin is warm and dry.  Neurological:     General: No focal deficit present.     Mental Status: He is alert.  Psychiatric:        Mood and Affect: Mood normal.        Behavior: Behavior normal.     ED Results / Procedures / Treatments   Labs (all labs ordered are listed, but only abnormal results are displayed) Labs Reviewed  COMPREHENSIVE METABOLIC PANEL - Abnormal; Notable for the following components:      Result Value   Glucose, Bld 112 (*)    Calcium 8.7 (*)    Total Bilirubin 2.3 (*)    All other components within normal limits  URINALYSIS, ROUTINE W REFLEX MICROSCOPIC - Abnormal; Notable for the following components:   Hgb urine dipstick SMALL (*)    Bacteria, UA RARE (*)    All other components within normal limits  LIPASE, BLOOD  CBC WITH DIFFERENTIAL/PLATELET    EKG None  Radiology CT ABDOMEN PELVIS W CONTRAST  Result Date: 11/20/2022 CLINICAL DATA:  Right lower quadrant abdominal pain EXAM: CT ABDOMEN AND PELVIS WITH CONTRAST TECHNIQUE: Multidetector CT imaging of the abdomen and pelvis was performed using the standard protocol following bolus administration of intravenous contrast. RADIATION DOSE REDUCTION: This exam was performed according to the departmental dose-optimization program  which includes automated exposure control, adjustment of the mA and/or kV according to patient size and/or use of iterative reconstruction technique. CONTRAST:  75mL OMNIPAQUE IOHEXOL 350 MG/ML SOLN COMPARISON:  11/10/2022 and 05/30/2020 FINDINGS: Lower chest: Lung bases are clear. Hepatobiliary: Liver is within normal limits. Gallbladder is unremarkable. No intrahepatic or extrahepatic dilatation. Pancreas: Within normal limits. Spleen: Within normal limits Adrenals/Urinary Tract: Adrenal glands are within normal limits. Kidneys are within normal limits.  No hydronephrosis. Bladder is within normal limits. Stomach/Bowel: Stomach is within normal limits. No evidence of bowel obstruction. Stable appendix dating back to 2022, without findings to suggest acute  appendicitis. No colonic wall thickening or inflammatory changes. Vascular/Lymphatic: No evidence of abdominal aortic aneurysm. Retroaortic left renal vein. No suspicious abdominopelvic lymphadenopathy. Reproductive: Prostate is unremarkable. Other: No abdominopelvic ascites. Postsurgical changes related to prior right inguinal hernia repair. Musculoskeletal: Visualized osseous structures are within normal limits. IMPRESSION: No evidence of appendicitis. Otherwise negative CT abdomen/pelvis. Electronically Signed   By: Charline Bills M.D.   On: 11/20/2022 23:24    Procedures Procedures    Medications Ordered in ED Medications  morphine (PF) 4 MG/ML injection 4 mg (4 mg Intravenous Given 11/20/22 2052)  iohexol (OMNIPAQUE) 350 MG/ML injection 75 mL (75 mLs Intravenous Contrast Given 11/20/22 2318)    ED Course/ Medical Decision Making/ A&P                             Medical Decision Making Amount and/or Complexity of Data Reviewed Independent Historian: spouse External Data Reviewed: notes. Labs: ordered. Decision-making details documented in ED Course. Radiology: ordered and independent interpretation performed. Decision-making details  documented in ED Course.  Risk Prescription drug management.   This patient presents to the ED for concern of RLQ abdominal pain, this involves an extensive number of treatment options, and is a complaint that carries with it a high risk of complications and morbidity.  The differential diagnosis includes appendicitis, kidney stone, diverticulitis, bowel obstruction, UTI, etc  42 year old male presents to the ED due to right lower quadrant abdominal pain that has been intermittent for the past month.  Patient had a CT scan on 6/8 which showed possible early appendicitis.  At that time patient was prescribed ciprofloxacin with improvement in symptoms.  He was then seen again in the ED on 6/18 where general surgery evaluated patient who did not feel his pain was related to his appendix.  Patient returns today per PCP due to concern about appendicitis.  Patient states pain started a few hours prior to arrival.  Denies nausea, vomiting, diarrhea.  Previous hernia repair.  Previous history of kidney stone.  Upon arrival, stable vitals.  Patient in no acute distress.  Mild right lower quadrant tenderness without rebound or guarding.  No rash to suggest shingles.  Routine labs ordered.  CT abdomen ordered to rule out appendicitis versus kidney stone versus diverticulitis versus other etiologies of abdominal pain.  No testicular pain.  Low suspicion for testicular torsion.  Morphine given.  CBC reassuring.  No leukocytosis.  Normal hemoglobin.  CMP significant for hyperglycemia 112.  No anion gap.  Normal renal function.  UA significant for small hematuria and rare bacteria.  Low suspicion for acute cystitis.  Lipase normal.  CT abdomen personally reviewed and interpreted which is negative for signs of appendicitis.  No abnormalities.  Unclear etiology of abdominal pain.  Low suspicion for dissection. No evidence of kidney stone on CT. Upon reassessment patient admits to improvement in pain.  Will discharge  patient with ibuprofen.  Advised patient to follow-up with PCP within the next 2 to 3 days for recheck.  Patient stable for discharge. Strict ED precautions discussed with patient. Patient states understanding and agrees to plan. Patient discharged home in no acute distress and stable vitals  Lives at home Has PCP       Final Clinical Impression(s) / ED Diagnoses Final diagnoses:  Right lower quadrant abdominal pain    Rx / DC Orders ED Discharge Orders          Ordered  ibuprofen (ADVIL) 600 MG tablet  Every 6 hours PRN        11/20/22 2341              Mannie Stabile, PA-C 11/20/22 2341    Vanetta Mulders, MD 11/21/22 1410

## 2022-11-20 NOTE — Discharge Instructions (Addendum)
It was a pleasure taking care of you today. As discussed, your CT scan did not show any abnormalities of your appendix. Labs were reassuring. Follow-up with PCP early next week for further evaluation. Return to the ER for new or worsening symptoms.

## 2023-08-26 ENCOUNTER — Other Ambulatory Visit (HOSPITAL_COMMUNITY): Payer: Self-pay | Admitting: Nurse Practitioner

## 2023-08-26 DIAGNOSIS — R17 Unspecified jaundice: Secondary | ICD-10-CM

## 2023-09-05 ENCOUNTER — Ambulatory Visit (HOSPITAL_BASED_OUTPATIENT_CLINIC_OR_DEPARTMENT_OTHER)
Admission: RE | Admit: 2023-09-05 | Discharge: 2023-09-05 | Disposition: A | Discharge status: Home / Self Care | Payer: Self-pay | Source: Ambulatory Visit | Attending: Nurse Practitioner | Admitting: Nurse Practitioner

## 2023-09-05 DIAGNOSIS — R17 Unspecified jaundice: Secondary | ICD-10-CM | POA: Insufficient documentation
